# Patient Record
Sex: Male | Born: 1960 | Race: White | Hispanic: No | Marital: Married | Smoking: Former smoker
Health system: Southern US, Community
[De-identification: ages and names within clinical notes are randomized; demographics above are authoritative.]

## PROBLEM LIST (undated history)

## (undated) DIAGNOSIS — I499 Cardiac arrhythmia, unspecified: Secondary | ICD-10-CM

## (undated) DIAGNOSIS — R7303 Prediabetes: Secondary | ICD-10-CM

## (undated) DIAGNOSIS — Z8489 Family history of other specified conditions: Secondary | ICD-10-CM

## (undated) DIAGNOSIS — K649 Unspecified hemorrhoids: Secondary | ICD-10-CM

## (undated) DIAGNOSIS — R519 Headache, unspecified: Secondary | ICD-10-CM

## (undated) DIAGNOSIS — K645 Perianal venous thrombosis: Secondary | ICD-10-CM

## (undated) DIAGNOSIS — N189 Chronic kidney disease, unspecified: Secondary | ICD-10-CM

## (undated) DIAGNOSIS — H53002 Unspecified amblyopia, left eye: Secondary | ICD-10-CM

## (undated) DIAGNOSIS — M5416 Radiculopathy, lumbar region: Secondary | ICD-10-CM

## (undated) DIAGNOSIS — H9319 Tinnitus, unspecified ear: Secondary | ICD-10-CM

## (undated) DIAGNOSIS — A159 Respiratory tuberculosis unspecified: Secondary | ICD-10-CM

## (undated) DIAGNOSIS — M1990 Unspecified osteoarthritis, unspecified site: Secondary | ICD-10-CM

## (undated) HISTORY — PX: TONSILLECTOMY: SUR1361

## (undated) HISTORY — DX: Unspecified hemorrhoids: K64.9

## (undated) HISTORY — DX: Perianal venous thrombosis: K64.5

## (undated) HISTORY — DX: Tinnitus, unspecified ear: H93.19

## (undated) HISTORY — DX: Cardiac arrhythmia, unspecified: I49.9

## (undated) HISTORY — PX: COLONOSCOPY: SHX174

---

## 2001-07-16 ENCOUNTER — Encounter: Payer: Self-pay | Admitting: Emergency Medicine

## 2001-07-16 ENCOUNTER — Emergency Department (HOSPITAL_COMMUNITY): Admission: EM | Admit: 2001-07-16 | Discharge: 2001-07-16 | Payer: Self-pay | Admitting: Emergency Medicine

## 2002-04-07 ENCOUNTER — Ambulatory Visit (HOSPITAL_BASED_OUTPATIENT_CLINIC_OR_DEPARTMENT_OTHER): Admission: RE | Admit: 2002-04-07 | Discharge: 2002-04-07 | Payer: Self-pay | Admitting: Pulmonary Disease

## 2004-11-22 ENCOUNTER — Encounter: Admission: RE | Admit: 2004-11-22 | Discharge: 2004-11-22 | Payer: Self-pay | Admitting: Family Medicine

## 2004-12-11 HISTORY — PX: BACK SURGERY: SHX140

## 2005-07-16 ENCOUNTER — Emergency Department (HOSPITAL_COMMUNITY): Admission: EM | Admit: 2005-07-16 | Discharge: 2005-07-16 | Payer: Self-pay | Admitting: Emergency Medicine

## 2005-07-26 ENCOUNTER — Ambulatory Visit (HOSPITAL_COMMUNITY): Admission: RE | Admit: 2005-07-26 | Discharge: 2005-07-27 | Payer: Self-pay | Admitting: Neurosurgery

## 2008-05-14 ENCOUNTER — Ambulatory Visit (HOSPITAL_BASED_OUTPATIENT_CLINIC_OR_DEPARTMENT_OTHER): Admission: RE | Admit: 2008-05-14 | Discharge: 2008-05-14 | Payer: Self-pay | Admitting: Orthopedic Surgery

## 2008-10-01 ENCOUNTER — Encounter: Admission: RE | Admit: 2008-10-01 | Discharge: 2008-10-01 | Payer: Self-pay | Admitting: Gastroenterology

## 2009-01-11 HISTORY — PX: TOTAL KNEE ARTHROPLASTY: SHX125

## 2009-01-18 ENCOUNTER — Inpatient Hospital Stay (HOSPITAL_COMMUNITY): Admission: RE | Admit: 2009-01-18 | Discharge: 2009-01-21 | Payer: Self-pay | Admitting: Orthopedic Surgery

## 2011-03-28 LAB — CBC
HCT: 32.3 % — ABNORMAL LOW (ref 39.0–52.0)
HCT: 42.5 % (ref 39.0–52.0)
Hemoglobin: 12.5 g/dL — ABNORMAL LOW (ref 13.0–17.0)
Hemoglobin: 14.4 g/dL (ref 13.0–17.0)
MCHC: 34.9 g/dL (ref 30.0–36.0)
MCHC: 35.2 g/dL (ref 30.0–36.0)
MCHC: 35.2 g/dL (ref 30.0–36.0)
MCV: 93.5 fL (ref 78.0–100.0)
MCV: 93.9 fL (ref 78.0–100.0)
MCV: 94.8 fL (ref 78.0–100.0)
Platelets: 203 10*3/uL (ref 150–400)
Platelets: 219 10*3/uL (ref 150–400)
RBC: 4.5 MIL/uL (ref 4.22–5.81)
RDW: 12.7 % (ref 11.5–15.5)
RDW: 12.9 % (ref 11.5–15.5)
RDW: 13 % (ref 11.5–15.5)
WBC: 5.8 10*3/uL (ref 4.0–10.5)
WBC: 8.6 10*3/uL (ref 4.0–10.5)

## 2011-03-28 LAB — BASIC METABOLIC PANEL
BUN: 8 mg/dL (ref 6–23)
CO2: 29 mEq/L (ref 19–32)
Calcium: 8.8 mg/dL (ref 8.4–10.5)
Chloride: 103 mEq/L (ref 96–112)
Chloride: 98 mEq/L (ref 96–112)
Creatinine, Ser: 0.69 mg/dL (ref 0.4–1.5)
Creatinine, Ser: 0.78 mg/dL (ref 0.4–1.5)
Glucose, Bld: 166 mg/dL — ABNORMAL HIGH (ref 70–99)
Glucose, Bld: 189 mg/dL — ABNORMAL HIGH (ref 70–99)
Sodium: 134 mEq/L — ABNORMAL LOW (ref 135–145)

## 2011-03-28 LAB — PROTIME-INR
INR: 0.9 (ref 0.00–1.49)
INR: 1 (ref 0.00–1.49)
INR: 1.4 (ref 0.00–1.49)
Prothrombin Time: 12.6 seconds (ref 11.6–15.2)
Prothrombin Time: 16.4 seconds — ABNORMAL HIGH (ref 11.6–15.2)
Prothrombin Time: 18 seconds — ABNORMAL HIGH (ref 11.6–15.2)

## 2011-03-28 LAB — URINALYSIS, ROUTINE W REFLEX MICROSCOPIC
Glucose, UA: NEGATIVE mg/dL
Specific Gravity, Urine: 1.005 (ref 1.005–1.030)
pH: 6.5 (ref 5.0–8.0)

## 2011-03-28 LAB — COMPREHENSIVE METABOLIC PANEL
Alkaline Phosphatase: 54 U/L (ref 39–117)
BUN: 8 mg/dL (ref 6–23)
Chloride: 106 mEq/L (ref 96–112)
Glucose, Bld: 99 mg/dL (ref 70–99)
Potassium: 3.6 mEq/L (ref 3.5–5.1)
Total Bilirubin: 0.9 mg/dL (ref 0.3–1.2)

## 2011-04-25 NOTE — H&P (Signed)
NAME:  Russell Gillespie, Russell Gillespie        ACCOUNT NO.:  [id number]   MEDICAL RECORD NO.:  [id number]          PATIENT TYPE:  INP   LOCATION:  0003                         FACILITY:  [facility]   PHYSICIAN:  Ollen Gross, M.D.    DATE OF BIRTH:  04-Jan-1961   DATE OF ADMISSION:  01/18/2009  DATE OF DISCHARGE:                              HISTORY & PHYSICAL   CHIEF COMPLAINT:  Left knee pain.   HISTORY OF PRESENT ILLNESS:  The patient is a 50-year male who has been  seen by Dr. Lequita Halt for ongoing left knee pain and instability.  He  recently injured his back playing football.  Ended up with an ACL  reconstruction back in 2001.  He was known to have some degeneration  changes when his knee was scoped earlier this year with some attenuation  of his ACL.  He has had progressive pain.  He was seen by Dr. Lequita Halt  and found to have significant bone-on-bone tricompartmental changes  already.  It is felt he would reach benefit from undergoing surgical  intervention.  Risks and benefits have been discussed.  He elects to  proceed with surgery.   ALLERGIES:  NO KNOWN DRUG ALLERGIES.   CURRENT MEDICATIONS:  Omeprazole, glucosamine chondroitin, fish oil and  Zyrtec.   PAST MEDICAL HISTORY:  Reflux disease and history of renal calculi.   PAST SURGICAL HISTORY:  Left knee ACL reconstruction.  He has had a left  knee scope, right knee scope, L4-5 disk rupture repair.   FAMILY HISTORY:  No documented medical history.   SOCIAL HISTORY:  Married and works as an Electronics engineer-  conditioning.  Past smoker, quit about 3 years ago.  No alcohol.  Two  children.  Does have a caregiver lined up.  He has three steps entering  his home.   REVIEW OF SYSTEMS:  GENERAL:  No fevers, chills, night sweats.  NEUROLOGICAL:  No seizures, syncope, paralysis.  RESPIRATORY:  No  shortness of breath, productive cough or hemoptysis.  CARDIOVASCULAR:  No chest pain, or orthopnea.  GI:  No nausea, vomiting,  diarrhea or  constipation.  GU:  No dysuria or hematuria.  MUSCULOSKELETAL:  Left  knee.   PHYSICAL EXAMINATION:  VITAL SIGNS:  Pulse 60, respirations 12, blood  pressure 118/76.  GENERAL:  A 50 year old white male, well-nourished, well-developed, tall  frame, alert, oriented and cooperative, good historian.  HEENT:  Normocephalic, atraumatic.  Pupils are reactive.  Oropharynx  clear.  EOMs intact.  NECK:  Supple.  CHEST:  Clear.  HEART:  Regular rate and rhythm without murmur, S1-S2 noted.  ABDOMEN:  Soft, nontender.  Bowel sounds present.  RECTAL/BREASTS/GENITOURINARY:  Not done.  Not pertinent to present  illness.  EXTREMITIES:  Left knee, no effusion.  Range of motion 0-125, marked  crepitus, significant laxity.   IMPRESSION:  Osteoarthritis left knee.   PLAN:  The patient admitted to [facility] to undergo a left  total knee replacement arthroplasty.  Surgery will be performed by Dr.  Ollen Gross.      Alexzandrew L. Perkins, P.A.C.      Ollen Gross, M.D.  Electronically Signed    ALP/MEDQ  D:  01/18/2009  T:  01/18/2009  Job:  [id number]   cc:   Stacie Acres. Cliffton Asters, M.D.  Fax: [phone]   Ollen Gross, M.D.  Fax: [phone]

## 2011-04-25 NOTE — Op Note (Signed)
NAME:  Russell Gillespie, Russell Gillespie        ACCOUNT NO.:  [id number]   MEDICAL RECORD NO.:  [id number]          PATIENT TYPE:  AMB   LOCATION:  DSC                          FACILITY:  [facility]   PHYSICIAN:  Loreta Ave, M.D. DATE OF BIRTH:  [birth year]   DATE OF PROCEDURE:  05/14/2008  DATE OF DISCHARGE:                               OPERATIVE REPORT   PREOPERATIVE DIAGNOSES:  1. Left knee degenerative arthritis.  2. Previous arthroscopic anterior cruciate ligament reconstruction      with allograft.  3. New traumatic injury with lateral meniscus tear, in question injury      of the anterior cruciate ligament graft.   POSTOPERATIVE DIAGNOSES:  1. Left knee degenerative arthritis.  2. Previous arthroscopic anterior cruciate ligament reconstruction      with allograft.  3. New traumatic injury with lateral meniscus tear in question injury      of the anterior cruciate ligament graft with stretch injury of      anterior cruciate ligament but without significant instability.      One large and numerous small loose bodies.  Recurrent tear, lateral      meniscus.  Grade 3 changes all 3 compartments.   PROCEDURE:  1. Left knee exam under anesthesia, arthroscopy, tricompartmental      chondroplasty, removal of numerous small and 1 large loose body.  2. Partial lateral meniscectomy.  3. Assessment of anterior cruciate ligament.   SURGEON:  Loreta Ave, MD   ASSISTANT:  Genene Churn. Barry Dienes, [place]   ANESTHESIA:  General.   BLOOD LOSS:  Minimal.   SPECIMENS:  None.   CULTURES:  None.   COMPLICATIONS:  None.   DRESSING:  Soft compressive.   TOURNIQUET TIME:  30 minutes.   PROCEDURE:  The patient was brought to the operating room, placed on the  operating table in supine position.  After adequate anesthesia has been  obtained, knee examined.  Increased excursion with Lachman and drawer  but still coming to an endpoint and no rotary instability.  Full motion  of the ligament stable.   Tourniquet applied, prepped and draped in usual  sterile fashion.  Exsanguinated with elevation and Esmarch tourniquet  inflated to 350 mmHg.  Three portals created, one superolateral, one  each medial and lateral parapatellar.  Inflow catheter induced and knee  distended.  Arthroscope was introduced and knee inspected.  Good  patellofemoral tracking.  Grade 3 changes.  Numerous chondral loose  bodies.  One large about a centimeter osteochondral loose body found and  removed with a large grasping forceps.  ACL graft had some attenuation  stretching but was still intact and functional.  Some spurring around  and debris in the notch.  Medial compartment diffuse grade 3,  approaching grade 4 changes.  Previous near-complete medial  meniscectomy.  No recurrent tears.  Lateral compartment grade 3 changes  with recurrent tearing of what was left of the posterior half.  Taken  out to stable rim, tapered into remaining meniscus.  All compartments  then inspected.  All loose fragments were removed.  ACL thoroughly  inspected from all angles and I did not feel  revision reconstruction was  indicated.  Instruments fully removed.  Portals and knee were injected  with Marcaine.  Portals closed with 4-0 nylon.  Sterile compressive  dressing applied.  Anesthesia reversed.  Brought to recovery room.  Tolerated the surgery well.  No complications.      Loreta Ave, M.D.  Electronically Signed     DFM/MEDQ  D:  05/14/2008  T:  05/15/2008  Job:  [id number]

## 2011-04-25 NOTE — Op Note (Signed)
NAME:  Russell Gillespie, Russell Gillespie        ACCOUNT NO.:  [id number]   MEDICAL RECORD NO.:  [id number]          PATIENT TYPE:  INP   LOCATION:  1619                         FACILITY:  [facility]   PHYSICIAN:  Ollen Gross, M.D.    DATE OF BIRTH:  04/11/1961   DATE OF PROCEDURE:  01/18/2009  DATE OF DISCHARGE:                               OPERATIVE REPORT   PREOPERATIVE DIAGNOSIS:  Osteoarthritis left knee.   POSTOPERATIVE DIAGNOSIS:  Osteoarthritis left knee.   PROCEDURE:  Left total knee arthroplasty.   SURGEON:  F. Aluisio, M.D.   ASSISTANT:  Oneida Alar, PA-C   ANESTHESIA:  General with postoperative Marcaine pain pump.   ESTIMATED BLOOD LOSS:  Minimal.   DRAINS:  None.   TOURNIQUET TIME:  40 minutes at 300 mmHg.   COMPLICATIONS:  None.   CONDITION:  Stable to recovery room.   BRIEF CLINICAL NOTE:  Russell Gillespie is a 50 year old male who has severe  arthritic change of his left knee post injury and ACL reconstruction.  He has had progressively worsening pain, instability and dysfunction.  He presents now for a total knee arthroplasty.   PROCEDURE IN DETAIL:  After successful administration of general  anesthetic, a tourniquet is placed high on his left thigh and his left  lower extremity is prepped and draped in the usual sterile fashion.  The  extremity is wrapped in Esmarch, knee flexed, tourniquet inflated to 300  mmHg.  A midline incision is made with a 10 blade through subcutaneous  tissue to the level of the extensor mechanism.  A fresh blade is used to  make a medial parapatellar arthrotomy.  Soft tissue on the proximal  medial tibia is subperiosteally elevated to the joint line with the  knife and into the semimembranosus bursa with a Cobb elevator.  Soft  tissue laterally is elevated with attention being paid to avoid the  patellar tendon on the tibial tubercle.  Patella subluxed laterally,  knee flexed 90 degrees and the PCL removed.  The ACL graft was just a  flimsy  piece of soft tissue at this point, it was nonfunctional.  It is  also removed.  A drill is used to create a starting hole in the distal  femur and the canal is thoroughly irrigated.  The 5 degree left valgus  alignment guide is placed and referencing off the posterior condyles  rotation is marked and the block pinned to remove 10 mm off the distal  femur.  Distal femoral resection is made with an oscillating saw.  Sizing blocks placed, size 4 is the most appropriate.  Rotation is  marked off the epicondylar axis.  The size 4 cutting block is placed and  the anterior-posterior chamfer cuts are made.   The tibia is subluxed forward and the menisci are removed.  The  extramedullary tibial alignment guide is placed referencing proximally  at the medial aspect of the tibial tubercle and distally along the  second metatarsal axis and tibial crest.  A block is pinned to remove  about 10 mm off the non-deficient lateral side.  Tibial resection is  made with an oscillating saw.  Size  5 is the most appropriate tibial  component and the proximal tibia is prepared with the modular drill and  keel punch for the size 5.  Femoral preparation is completed with the  intercondylar cut for the size 4.   Size 5 mobile bearing tibial trial, size 4 posterior stabilized femoral  trial and a 10 mm posterior stabilized rotating platform insert trial  are placed.  At the tendon, there is a tiny bit of varus-valgus plane  with the 12.5 which allowed full extension with excellent varus valgus  anterior-posterior balance throughout full range of motion.  The patella  was then everted and it was measured to be 27 mm.  Freehand resection is  taken to 15 mm, a 41 template is placed, lug holes are drilled, a trial  patella is placed and it tracks normally.  Osteophytes are removed off  the posterior femur with the trial in place.  All trials are removed and  the cut bone surfaces are prepared with pulsatile lavage.   Cement is  mixed and, once ready for implantation, the size 5 mobile bearing tibial  tray size 4 posterior stabilized femur and 41 patella are cemented into  place and the patella is held with a clamp.  Trial 12.5-mm inserts are  placed, knee held in full extension and all extruded cement removed.  When the cement is fully hardened, then the permanent 12.5 mm posterior  stabilized rotating platform insert is placed into the tibial tray.  The  wounds are copiously irrigated with saline solution.  The FloSeal is  injected on the posterior capsule, mediolateral gutters and  suprapatellar area.  A moist sponge is placed and the tourniquet  released for a total time of 40 minutes.  The sponge is held for 2  minutes and removed.  Mild bleeding is encountered.  The bleeding that  is encountered is stopped with electrocautery.  The wounds are again  irrigated and the arthrotomy closed with interrupted #1 PDS.  Flexion  against gravity is about 140 degrees.  Subcu is closed with interrupted  2-0 Vicryl and subcuticular running 4-0 Monocryl.  The catheter for the  Marcaine pain pump is placed and the pump is initiated.  Steri-Strips  and a bulky sterile dressing are applied.  The knee is placed into a  knee immobilizer.  The patient is awakened and transported to recovery  in stable condition.      Ollen Gross, M.D.  Electronically Signed     FA/MEDQ  D:  01/18/2009  T:  01/18/2009  Job:  [id number]

## 2011-04-28 NOTE — Discharge Summary (Signed)
NAME:  Russell Gillespie, Russell Gillespie        ACCOUNT NO.:  [id number]   MEDICAL RECORD NO.:  [id number]          PATIENT TYPE:  INP   LOCATION:  1619                         [facility]ACILITY:  [facility]   PHYSICIAN:  Ollen Gross, M.D.    DATE OF BIRTH:  [birth year]   DATE OF ADMISSION:  01/18/2009  DATE OF DISCHARGE:  01/21/2009                               DISCHARGE SUMMARY   ADMITTING DIAGNOSES:  1. Osteoarthritis, left knee.  2. Reflux.  3. History of renal calculi.   DISCHARGE DIAGNOSES:  1. Osteoarthritis, left knee, status post left total knee replacement      arthroplasty.  2. Mild postoperative hyponatremia.  3. Reflux.  4. History of renal calculi.   PROCEDURE:  January 18, 2009:  Left total knee replacement arthroplasty.   SURGEON:  Dr. Lequita Halt assisted by Oneida Alar, PA-C.   ANESTHESIA:  General.   TOURNIQUET TIME:  Forty minutes.   CONSULTS:  None.   BRIEF HISTORY:  Russell Gillespie is a 47-year male with severe end-stage  arthritis, left knee, post-injury ACL reconstruction, progressive  worsening pain, instability and dysfunction who now presents for total  knee.   LABORATORY DATA:  Preop CBC showed hemoglobin of 14.4, hematocrit 42.5,  white cell count 5.8, platelets 245, postop hemoglobin 12.5, drifted  down to 11.4, back up to 11.6 with a hematocrit of 33.  PT/PTT preop  12.6 and 24 respectively.  INR 0.9.  Serial protimes were followed per  Coumadin protocol.  Last PT/INR were 18 and 1.4 respectively.  Chem  panel on admission:  All within normal limits.  Serial BMETs were  followed.  Sodium dropped from 140 to 134, back up to 137.  Preop UA was  negative.  Blood group type A positive.   EKG May 14, 2008:  Sinus bradycardia with marked sinus arrhythmia,  otherwise normal.  No previous tracing; confirmed by Dr. Jerral Bonito.   HOSPITAL COURSE:  The patient was admitted to [facility] and  taken to the OR, underwent above-stated procedure without complication.  The  patient tolerated the procedure well, later transferred to the  recovery room and orthopedic floor, started on PCA and p.o. analgesics  for pain control following surgery.  He was started on Coumadin for DVT  prophylaxis.  He was doing pretty well on the morning of day 1.  Pain  was under good control.  He had a little bit of drop in his sodium which  was felt to be of dilutional component.  He did really well with his  therapy, walking over 200 feet.  He was started back on his home meds of  omeprazole.  By postop day #2, he had increased up to 300 feet with  minimal assistance.  Dressing change incision looked good.  Hemoglobin  was stable.  Sodium was already back up to 137.  We discontinued his  fluids, weaning him over to p.o. medications, and he was ready go home  by the following day of January 21, 2009.   DISCHARGE PLAN:  1. The patient was discharged home on January 21, 2009.  2. Discharge diagnoses:  Please see above.  3.  Discharge medications:  Vicodin, Lovenox, Robaxin and Coumadin.  4. Diet:  No restrictions.  Diet as tolerated.  5. Followup:  Two weeks.  6. Activity:  Weightbearing as tolerated.  Total knee protocol.  Home      health PT and nursing.   DISPOSITION:  Home.   CONDITION UPON DISCHARGE:  Improved.      Russell Gillespie, P.A.C.      Ollen Gross, M.D.  Electronically Signed    ALP/MEDQ  D:  02/23/2009  T:  02/23/2009  Job:  [id number]   cc:   Stacie Acres. Cliffton Asters, M.D.  Fax: [phone]

## 2011-04-28 NOTE — Op Note (Signed)
NAME:  BURREL, LEGRAND        ACCOUNT NO.:  [id number]   MEDICAL RECORD NO.:  [id number]          PATIENT TYPE:  OIB   LOCATION:  2550                         FACILITY:  [facility]   PHYSICIAN:  Hilda Lias, M.D.   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   PROCEDURE:  Right L4 hemilaminectomy and removal of fragment compromising  the L5 nerve root, foraminotomy, microscope.   SURGEON:  Hilda Lias, M.D.   ASSISTANT:  Clydene Fake, M.D.   CLINICAL HISTORY:  The patient was seen by me yesterday because of back and  right leg pain.  The pain is getting worse.  The patient had an MRI about  three months ago which shows a bulging disc.  MRI done yesterday showed  fragment compromising the L5 nerve root.  The patient wanted to proceed with  surgery as soon as possible.  The risks were explained during the history  and physical.   DESCRIPTION OF PROCEDURE:  The patient was taken to the OR and positioned in  a prone manner.  The back was prepped with Betadine.  A midline incision was  made.  The muscles were retracted laterally on the right side.  We did a  laminotomy of L4 and L5.  Nevertheless, trying to displace the thecal sac  was difficult because the area was quite a bit narrow and the canal was  vertical.  Because of that, we went ahead and proceeded with hemilaminectomy  of L4 on the right side and from then on, we worked our way down to the L4-  L5 space.  Indeed, the L5 nerve root was displaced posteriorly and  superiorly.  An incision was made into the area and three large fragments of  disc into the body of L5 and one to the foramen were removed.  Then, we  investigated the area of the disc space and, indeed, saw scar tissue  but  there was no opening.  Because of that, we decided not to enter the disc  space.  At the end, we had a good decompression.  The area was irrigated.  Hemostasis was done.  Depo-Medrol was left in the epidural space and the  wound was closed with Vicryl and Steri-Strips.           ______________________________  Hilda Lias, M.D.     EB/MEDQ  D:  07/26/2005  T:  07/26/2005  Job:  [id number]

## 2011-12-12 HISTORY — PX: ROTATOR CUFF REPAIR: SHX139

## 2012-05-23 ENCOUNTER — Encounter (INDEPENDENT_AMBULATORY_CARE_PROVIDER_SITE_OTHER): Payer: Self-pay | Admitting: General Surgery

## 2012-05-23 ENCOUNTER — Ambulatory Visit (INDEPENDENT_AMBULATORY_CARE_PROVIDER_SITE_OTHER): Payer: 59 | Admitting: Surgery

## 2012-05-23 ENCOUNTER — Encounter (INDEPENDENT_AMBULATORY_CARE_PROVIDER_SITE_OTHER): Payer: Self-pay | Admitting: Surgery

## 2012-05-23 VITALS — BP 120/82 | HR 68 | Temp 98.2°F | Resp 18 | Ht 73.0 in | Wt 235.6 lb

## 2012-05-23 DIAGNOSIS — K645 Perianal venous thrombosis: Secondary | ICD-10-CM

## 2012-05-23 HISTORY — DX: Perianal venous thrombosis: K64.5

## 2012-05-23 NOTE — Patient Instructions (Signed)
ANORECTAL SURGERY: POST OP INSTRUCTIONS  1. Take your usually prescribed home medications unless otherwise directed. 2. DIET: Follow a light bland diet the first 24 hours after arrival home, such as soup, liquids, crackers, etc.  Be sure to include lots of fluids daily.  Avoid fast food or heavy meals as your are more likely to get nauseated.  Eat a low fat the next few days after surgery.   3. PAIN CONTROL: a. Pain is best controlled by a usual combination of three different methods TOGETHER: i. Ice/Heat ii. Over the counter pain medication iii. Prescription pain medication b. Most patients will experience some swelling and discomfort in the anus/rectal area. and incisions.  Ice packs or heat (30-60 minutes up to 6 times a day) will help. Use ice for the first few days to help decrease swelling and bruising, then switch to heat such as warm towels, sitz baths, warm baths, etc to help relax tight/sore spots and speed recovery.  Some people prefer to use ice alone, heat alone, alternating between ice & heat.  Experiment to what works for you.  Swelling and bruising can take several weeks to resolve.   c. It is helpful to take an over-the-counter pain medication regularly for the first few weeks.  Choose one of the following that works best for you: i. Naproxen (Aleve, etc)  Two 220mg tabs twice a day ii. Ibuprofen (Advil, etc) Three 200mg tabs four times a day (every meal & bedtime) iii. Acetaminophen (Tylenol, etc) 500-650mg four times a day (every meal & bedtime) d. A  prescription for pain medication (such as oxycodone, hydrocodone, etc) should be given to you upon discharge.  Take your pain medication as prescribed.  i. If you are having problems/concerns with the prescription medicine (does not control pain, nausea, vomiting, rash, itching, etc), please call us [phone] to see if we need to switch you to a different pain medicine that will work better for you and/or control your side effect  better. ii. If you need a refill on your pain medication, please contact your pharmacy.  They will contact our office to request authorization. Prescriptions will not be filled after 5 pm or on week-ends. 4. KEEP YOUR BOWELS REGULAR a. The goal is one bowel movement a day b. Avoid getting constipated.  Between the surgery and the pain medications, it is common to experience some constipation.  Increasing fluid intake and taking a fiber supplement (such as Metamucil, Citrucel, FiberCon, MiraLax, etc) 1-2 times a day regularly will usually help prevent this problem from occurring.  A mild laxative (prune juice, Milk of Magnesia, MiraLax, etc) should be taken according to package directions if there are no bowel movements after 48 hours. c. Watch out for diarrhea.  If you have many loose bowel movements, simplify your diet to bland foods & liquids for a few days.  Stop any stool softeners and decrease your fiber supplement.  Switching to mild anti-diarrheal medications (Kayopectate, Pepto Bismol) can help.  If this worsens or does not improve, please call us.  5. Wound Care a. Remove your bandages the day after surgery.  Unless discharge instructions indicate otherwise, leave your bandage dry and in place overnight.  Remove the bandage during your first bowel movement.   b. Allow the wound packing to fall out over the next few days.  You can trim exposed gauze / ribbon as it falls out.  You do not need to repack the wound unless instructed otherwise.  Wear an   absorbent pad or soft cotton gauze in your underwear as needed to catch any drainage and help keep the area  c. Keep the area clean and dry.  Bathe / shower every day.  Keep the area clean by showering / bathing over the incision / wound.   It is okay to soak an open wound to help wash it.  Wet wipes or showers / gentle washing after bowel movements is often less traumatic than regular toilet paper. d. Bonita Quin may have some styrofoam-like soft packing in  the rectum which will come out with the first bowel movement.  e. You will often notice bleeding with bowel movements.  This should slow down by the end of the first week of surgery f. Expect some drainage.  This should slow down, too, by the end of the first week of surgery.  Wear an absorbent pad or soft cotton gauze in your underwear until the drainage stops. 6. ACTIVITIES as tolerated:   a. You may resume regular (light) daily activities beginning the next day--such as daily self-care, walking, climbing stairs--gradually increasing activities as tolerated.  If you can walk 30 minutes without difficulty, it is safe to try more intense activity such as jogging, treadmill, bicycling, low-impact aerobics, swimming, etc. b. Save the most intensive and strenuous activity for last such as sit-ups, heavy lifting, contact sports, etc  Refrain from any heavy lifting or straining until you are off narcotics for pain control.   c. DO NOT PUSH THROUGH PAIN.  Let pain be your guide: If it hurts to do something, don't do it.  Pain is your body warning you to avoid that activity for another week until the pain goes down. d. You may drive when you are no longer taking prescription pain medication, you can comfortably sit for long periods of time, and you can safely maneuver your car and apply brakes. e. Bonita Quin may have sexual intercourse when it is comfortable.  7. FOLLOW UP in our office a. Please call [facility] at [phone] to set up an appointment to see your surgeon in the office for a follow-up appointment approximately 2 weeks after your surgery. b. Make sure that you call for this appointment the day you arrive home to insure a convenient appointment time. 10. IF YOU HAVE DISABILITY OR FAMILY LEAVE FORMS, BRING THEM TO THE OFFICE FOR PROCESSING.  DO NOT GIVE THEM TO YOUR DOCTOR.        WHEN TO CALL us [phone]: 1. Poor pain control 2. Reactions / problems with new medications (rash/itching, nausea,  etc)  3. Fever over 101.5 F (38.5 C) 4. Inability to urinate 5. Nausea and/or vomiting 6. Worsening swelling or bruising 7. Continued bleeding from incision. 8. Increased pain, redness, or drainage from the incision  The clinic staff is available to answer your questions during regular business hours (8:30am-5pm).  Please don't hesitate to call and ask to speak to one of our nurses for clinical concerns.   A surgeon from [facility] is always on call at the hospitals   If you have a medical emergency, go to the nearest emergency room or call 911.    [facility] [address], [place], [place]  [address] ? MAIN: [phone] ? TOLL FREE: [phone] ? FAX [phone] [email or web address]  HEMORRHOIDS   The rectum is the last few inches of your colon, and it naturally stretches to hold stool.  Hemorrhoidal piles are natural clusters of blood  clusters of blood vessels that help the rectum stretch to hold stool and allow bowel movements to eliminate feces.  Hemorrhoids are abnormally swollen blood vessels in the rectum.  Too much pressure in the rectum causes hemorrhoids by forcing blood to stretch and bulge the walls of the veins, sometimes even rupturing them.  Hemorrhoids can become like varicose veins you might see on a person's legs. When bulging hemorrhoidal veins are irritated, they can swell, burn, itch, become very painful, and bleed. Once the rectal veins have been stretched out and hemorrhoids created, they are difficult to get rid of completely and tend to recur with less straining than it took to cause them in the first place. Fortunately, good habits and simple medical treatment usually control hemorrhoids well, and surgery is only recommended in unusually severe cases. Some of the most frequent causes of hemorrhoids:    Constant sitting    Straining with bowel movements (from constipation or hard stools)    Diarrhea    Sitting  on the toilet for a long time    Severe coughing    Childbirth    Heavy Lifting  Types of Hemorrhoids:    Internal hemorrhoids usually don't hurt or itch; they are deep inside the rectum and usually have no sensation. However, internal hemorrhoids can bleed.  Such bleeding should not be ignored and mask blood from a dangerous source like colorectal cancer, so persistent rectal bleeding should be investigated with a colonoscopy.    External hemorrhoids cause most of the symptoms - pain, burning, and itching. Unirritated hemorrhoids can look like small skin tags coming out of the anus.     Thrombosed hemorrhoids can form when a hemorrhoid blood vessel bursts and causes the hemorrhoid to swell.  A purple blood clot can form in it and become an excruciatingly painful lump at the anus. Because of these unpleasant symptoms, immediate incision and drainage by a surgeon at an office visit can provide much relief of the pain.    PREVENTION Avoiding the causes listed in above will prevent most cases of hemorrhoids, but this advice is sometimes hard to follow:  How can you avoid sitting all day if you have a seated job? Also, we try to avoid coughing and diarrhea, but sometimes it's beyond your control.  Still, there are some practical hints to help:    If your main job activity is seated, always stand or walk during your breaks. Make it a point to stand and walk at least 5 minutes every hour and try to shift frequently in your chair to avoid direct rectal pressure.    Always exhale as you strain or lift. Don't hold your breath.    Treat coughing, diarrhea and constipation early since irritated hemorrhoids may soon follow.    Do not delay or try to prevent a bowel movement when the urge is present.   Exercise regularly (walking or jogging 60 minutes a day) to stimulate the bowels to move.   Avoid dry toilet paper when cleaning after bowel movements.  Moistened tissues such as baby wipes are less irritating.   Lightly pat the rectal area dry.  Using irrigating showers or bottle irrigation washing can more gently clean this sensitive area.   Keep the anal and genital area clean and  dry.  Talcum or baby powders can help   GET YOUR STOOLS SOFT.   This is the most important way to prevent irritated hemorrhoids.  Hard stools are like sandpaper to the anorectal   canal and will cause more problems.   The goal: ONE SOFT BOWEL MOVEMENT A DAY!  To have soft, regular bowel movements:    Drink at least 8 tall glasses of water a day.     AVOID CONSTIPATION    Take plenty of fiber.  Fiber is the undigested part of plant food that passes into the colon, acting s "natures broom" to encourage bowel motility and movement.  Fiber can absorb and hold large amounts of water. This results in a larger, bulkier stool, which is soft and easier to pass. Work gradually over several weeks up to 6 servings a day of fiber (25g a day even more if needed) in the form of: o Vegetables -- Root (potatoes, carrots, turnips), leafy green (lettuce, salad greens, celery, spinach), or cooked high residue (cabbage, broccoli, etc) o Fruit -- Fresh (unpeeled skin & pulp), Dried (prunes, apricots, cherries, etc ),  or stewed ( applesauce)  o Whole grain breads, pasta, etc (whole wheat)  o Bran cereals    Bulking Agents -- This type of water-retaining fiber generally is easily obtained each day by one of the following:  o Psyllium bran -- The psyllium plant is remarkable because its ground seeds can retain so much water. This product is available as Metamucil, Konsyl, Effersyllium, Per Diem Fiber, or the less expensive generic preparation in drug and health food stores. Although labeled a laxative, it really is not a laxative.  o Methylcellulose -- This is another fiber derived from wood which also retains water. It is available as Citrucel. o Polyethylene Glycol - and "artificial" fiber commonly called Miralax or Glycolax.  It is helpful for people  with gassy or bloated feelings with regular fiber o Flax Seed - a less gassy fiber than psyllium   No reading or other relaxing activity while on the toilet. If bowel movements take longer than 5 minutes, you are too constipated   Laxatives can be useful for a short period if constipation is severe o Osmotics (Milk of Magnesia, Fleets phosphosoda, Magnesium citrate, MiraLax, GoLytely) are safer than  o Stimulants (Senokot, Castor Oil, Dulcolax, Ex Lax)    o Do not take laxatives for more than 7days in a row.   Laxatives are not a good long-term solution as it can stress the intestine and colon and causes too much mineral and fluid losses.    If badly constipated, try a Bowel Retraining Program: o Do not use laxatives.  o Eat a diet high in roughage, such as bran cereals and leafy vegetables.  o Drink six (6) ounces of prune or apricot juice each morning.  o Eat two (2) large servings of stewed fruit each day.  o Take one (1) heaping dose of a bulking agent (ex. Metamucil, Citrucel, Miralax) twice a day.  o Use sugar-free sweetener when possible to avoid excessive calories.  o Eat a normal breakfast.  o Set aside 15 minutes after breakfast to sit on the toilet, but do not strain to have a bowel movement.  o If you do not have a bowel movement by the third day, use an enema and repeat the above steps.    AVOID DIARRHEA o Switch to liquids and simpler foods for a few days to avoid stressing your intestines further. o Avoid dairy products (especially milk & ice cream) for a short time.  The intestines often can lose the ability to digest lactose when stressed. o Avoid foods that cause gassiness or bloating.  Typical foods   other legumes, cabbage, broccoli, and dairy foods.  Every person has some sensitivity to other foods, so listen to our body and avoid those foods that trigger problems for you. o Adding fiber (Citrucel, Metamucil, psyllium, Miralax) gradually can help thicken stools by  absorbing excess fluid and retrain the intestines to act more normally.  Slowly increase the dose over a few weeks.  Too much fiber too soon can backfire and cause cramping & bloating. o Probiotics (such as active yogurt, Align, etc) may help repopulate the intestines and colon with normal bacteria and calm down a sensitive digestive tract.  Most studies show it to be of mild help, though, and such products can be costly. o Medicines:   Bismuth subsalicylate (ex. Kayopectate, Pepto Bismol) every 30 minutes for up to 6 doses can help control diarrhea.  Avoid if pregnant.   Loperamide (Immodium) can slow down diarrhea.  Start with two tablets (4mg  total) first and then try one tablet every 6 hours.  Avoid if you are having fevers or severe pain.  If you are not better or start feeling worse, stop all medicines and call your doctor for advice o Call your doctor if you are getting worse or not better.  Sometimes further testing (cultures, endoscopy, X-ray studies, bloodwork, etc) may be needed to help diagnose and treat the cause of the diarrhea.   If these preventive measures fail, you must take action right away! Hemorrhoids are one condition that can be mild in the morning and become intolerable by nightfall.

## 2012-05-23 NOTE — Progress Notes (Signed)
Subjective:     Patient ID: Russell Gillespie, male   DOB: 1961/04/12, 51 y.o.   MRN: [id number]  HPI  Russell Gillespie  06-09-1961 [id number]  Patient Care Team: Sissy Hoff, MD as PCP - General (Family Medicine)  This patient is a 51 y.o.male who presents today for surgical evaluation at the request of Dr. Azucena Cecil.   Reason for visit: Probable thrombosed hemorrhoid.  Patient is a pleasant male who's had hemorrhoid flares in the past. 3 have been severe. This is the worst one. It's been going on for a week. He saw his primary care physician. Suppositories were given. He does not think it's helped much. No bleeding. He has a bowel movement about every day. He recently had a colonoscopy that was uneventful. This did not start right after the colonoscopy. No prior anal/rectal treatments.  Patient Active Problem List  Diagnosis  . Internal thrombosed hemorrhoid-left lateral    Past Medical History  Diagnosis Date  . Hemorrhoids   . GERD (gastroesophageal reflux disease)   . Tinnitus   . Irregular heart beat   . Internal thrombosed hemorrhoid-left lateral 05/23/2012    Past Surgical History  Procedure Date  . Total knee arthroplasty     left    History   Social History  . Marital Status: Married    Spouse Name: N/A    Number of Children: N/A  . Years of Education: N/A   Occupational History  . Not on file.   Social History Main Topics  . Smoking status: Never Smoker   . Smokeless tobacco: Not on file  . Alcohol Use: Yes  . Drug Use: No  . Sexually Active:    Other Topics Concern  . Not on file   Social History Narrative  . No narrative on file    No family history on file.  Current Outpatient Prescriptions  Medication Sig Dispense Refill  . cetirizine (ZYRTEC) 10 MG tablet Take 10 mg by mouth daily.      . fexofenadine (ALLEGRA) 180 MG tablet Take 180 mg by mouth daily.      . fish oil-omega-3 fatty acids 1000 MG capsule Take 2 g by mouth  daily.      . hydrocortisone (ANUSOL-HC) 25 MG suppository Place 25 mg rectally 2 (two) times daily.      Marland Kitchen omeprazole (PRILOSEC) 20 MG capsule Take 20 mg by mouth daily.      . temazepam (RESTORIL) 30 MG capsule Take 30 mg by mouth at bedtime as needed.         No Known Allergies  BP 120/82  Pulse 68  Temp 98.2 F (36.8 C) (Temporal)  Resp 18  Ht 6\' 1"  (1.854 m)  Wt 235 lb 9.6 oz (106.867 kg)  BMI 31.08 kg/m2  No results found.   Review of Systems  Constitutional: Negative for fever, chills and diaphoresis.  HENT: Negative for sore throat, trouble swallowing and neck pain.   Eyes: Negative for photophobia and visual disturbance.  Respiratory: Negative for choking and shortness of breath.   Cardiovascular: Negative for chest pain and palpitations.  Gastrointestinal: Negative for nausea, vomiting, abdominal distention, anal bleeding and rectal pain.  Genitourinary: Negative for dysuria, urgency, difficulty urinating and testicular pain.  Musculoskeletal: Negative for myalgias, arthralgias and gait problem.  Skin: Negative for color change and rash.  Neurological: Negative for dizziness, speech difficulty, weakness and numbness.  Hematological: Negative for adenopathy.  Psychiatric/Behavioral: Negative for hallucinations, confusion and agitation.  Objective:   Physical Exam  Constitutional: He is oriented to person, place, and time. He appears well-developed and well-nourished. No distress.  HENT:  Head: Normocephalic.  Mouth/Throat: Oropharynx is clear and moist. No oropharyngeal exudate.  Eyes: Conjunctivae and EOM are normal. Pupils are equal, round, and reactive to light. No scleral icterus.  Neck: Normal range of motion. No tracheal deviation present.  Cardiovascular: Normal rate, normal heart sounds and intact distal pulses.   Pulmonary/Chest: Effort normal. No respiratory distress.  Abdominal: Soft. He exhibits no distension. There is no tenderness. Hernia  confirmed negative in the right inguinal area and confirmed negative in the left inguinal area.       Incisions clean with normal healing ridges.  No hernias  Genitourinary:       Exam done with assistance of male Medical Assistant in the room.  Perianal skin clean with good hygiene.  No pruritis.  No external skin tags / hemorrhoids of significance.  No pilonidal disease.  No fissure.  No abscess/fistula.    Tolerates digital and anoscopic rectal exam after anorectal block.  Normal sphincter tone.  No rectal masses.    Hemorrhoidal piles mildly enlarged except Left lateral thrombosed hemorrhoid - internal   Musculoskeletal: Normal range of motion. He exhibits no tenderness.  Neurological: He is alert and oriented to person, place, and time. No cranial nerve deficit. He exhibits normal muscle tone. Coordination normal.  Skin: Skin is warm and dry. No rash noted. He is not diaphoretic.  Psychiatric: He has a normal mood and affect. His behavior is normal.       Assessment:     Thrombosed left lateral hemorrhoid    Plan:     I went ahead and lanced it:  The anatomy & physiology of the anorectal region was discussed.  The pathophysiology of hemorrhoids and differential diagnosis was discussed.  Natural history progression  of worsening swelling with eventual resolution over weeks to months was discussed.   I stressed the importance of a bowel regimen to have daily soft bowel movements to minimize progression of disease.     The patient's symptoms are not adequately controlled.  Therefore, I recommended incision to drain the thrombosed hemorrhoid..  I went over the technique, risks, benefits, and alternatives.   Questions were answered.  The patient expressed understanding & wished to proceed.  The patient was positioned in the lateral decubitus position.  I placed a field block of local anaesthetic.  Perianal & rectal examination was done.  I incised & decompressed the thrombosed  hemorrhoid.  The patient tolerated the procedure well.    Educational handouts further explaining the pathology, treatment options, and bowel regimen were given as well.   Increase activity as tolerated.  Do not push through pain.  Advanced on diet as tolerated. Bowel regimen to avoid problems.  Return to clinic 3 weeks, p.r.n. if he resolves all Sx.  I gave him my card.   The patient expressed understanding and appreciation

## 2012-06-24 ENCOUNTER — Encounter (INDEPENDENT_AMBULATORY_CARE_PROVIDER_SITE_OTHER): Payer: 59 | Admitting: Surgery

## 2016-04-13 ENCOUNTER — Ambulatory Visit: Payer: Self-pay | Admitting: Orthopedic Surgery

## 2016-04-13 NOTE — Progress Notes (Signed)
Preoperative surgical orders have been place into the [facility] for Paulene FloorChristopher L Whelchel on 04/13/2016, 10:55 AM  by Patrica DuelPERKINS, Aianna Fahs for surgery on 05-03-16.  Preop Total Hip - Anterior Approach orders including IV Tylenol, and IV Decadron as long as there are no contraindications to the above medications. Avel Peacerew Camora Tremain, PA-C

## 2016-04-25 ENCOUNTER — Encounter (HOSPITAL_COMMUNITY): Payer: Self-pay

## 2016-04-25 ENCOUNTER — Encounter (HOSPITAL_COMMUNITY)
Admission: RE | Admit: 2016-04-25 | Discharge: 2016-04-25 | Disposition: A | Discharge status: Home / Self Care | Payer: Managed Care, Other (non HMO) | Source: Ambulatory Visit | Attending: Orthopedic Surgery | Admitting: Orthopedic Surgery

## 2016-04-25 DIAGNOSIS — M1611 Unilateral primary osteoarthritis, right hip: Secondary | ICD-10-CM | POA: Diagnosis not present

## 2016-04-25 DIAGNOSIS — I491 Atrial premature depolarization: Secondary | ICD-10-CM | POA: Insufficient documentation

## 2016-04-25 DIAGNOSIS — Z01812 Encounter for preprocedural laboratory examination: Secondary | ICD-10-CM | POA: Diagnosis not present

## 2016-04-25 DIAGNOSIS — Z0183 Encounter for blood typing: Secondary | ICD-10-CM | POA: Diagnosis not present

## 2016-04-25 DIAGNOSIS — Z01818 Encounter for other preprocedural examination: Secondary | ICD-10-CM | POA: Diagnosis not present

## 2016-04-25 HISTORY — DX: Chronic kidney disease, unspecified: N18.9

## 2016-04-25 HISTORY — DX: Respiratory tuberculosis unspecified: A15.9

## 2016-04-25 HISTORY — DX: Cardiac arrhythmia, unspecified: I49.9

## 2016-04-25 LAB — COMPREHENSIVE METABOLIC PANEL
ALBUMIN: 4.5 g/dL (ref 3.5–5.0)
ALT: 36 U/L (ref 17–63)
AST: 25 U/L (ref 15–41)
Alkaline Phosphatase: 57 U/L (ref 38–126)
Anion gap: 8 (ref 5–15)
BUN: 15 mg/dL (ref 6–20)
CHLORIDE: 106 mmol/L (ref 101–111)
CO2: 28 mmol/L (ref 22–32)
CREATININE: 0.86 mg/dL (ref 0.61–1.24)
Calcium: 9.6 mg/dL (ref 8.9–10.3)
GFR calc Af Amer: >60 mL/min (ref >60)
GFR calc non Af Amer: >60 mL/min (ref >60)
GLUCOSE: 102 mg/dL — AB (ref 65–99)
POTASSIUM: 4.1 mmol/L (ref 3.5–5.1)
SODIUM: 142 mmol/L (ref 135–145)
Total Bilirubin: 0.8 mg/dL (ref 0.3–1.2)
Total Protein: 6.9 g/dL (ref 6.5–8.1)

## 2016-04-25 LAB — URINALYSIS, ROUTINE W REFLEX MICROSCOPIC
BILIRUBIN URINE: NEGATIVE
Glucose, UA: NEGATIVE mg/dL
Hgb urine dipstick: NEGATIVE
KETONES UR: NEGATIVE mg/dL
LEUKOCYTES UA: NEGATIVE
NITRITE: NEGATIVE
PROTEIN: NEGATIVE mg/dL
Specific Gravity, Urine: 1.029 (ref 1.005–1.030)
pH: 6 (ref 5.0–8.0)

## 2016-04-25 LAB — APTT: aPTT: 24 seconds (ref 24–37)

## 2016-04-25 LAB — CBC
HEMATOCRIT: 40.8 % (ref 39.0–52.0)
Hemoglobin: 14.5 g/dL (ref 13.0–17.0)
MCH: 31.5 pg (ref 26.0–34.0)
MCHC: 35.5 g/dL (ref 30.0–36.0)
MCV: 88.5 fL (ref 78.0–100.0)
PLATELETS: 219 10*3/uL (ref 150–400)
RBC: 4.61 MIL/uL (ref 4.22–5.81)
RDW: 12.6 % (ref 11.5–15.5)
WBC: 6.9 10*3/uL (ref 4.0–10.5)

## 2016-04-25 LAB — SURGICAL PCR SCREEN
MRSA, PCR: NEGATIVE
STAPHYLOCOCCUS AUREUS: NEGATIVE

## 2016-04-25 LAB — PROTIME-INR
INR: 0.95 (ref 0.00–1.49)
Prothrombin Time: 12.9 seconds (ref 11.6–15.2)

## 2016-04-25 NOTE — Patient Instructions (Signed)
Russell Gillespie  04/25/2016   Your procedure is scheduled on: 05/03/16  Report to [facility] Main  Entrance take [facility]  elevators to 3rd floor to  Short Stay Center at 7:30 am  Call this number if you have problems the morning of surgery [phone]   Remember: ONLY 1 PERSON MAY GO WITH YOU TO SHORT STAY TO GET  READY MORNING OF YOUR SURGERY.  Do not eat food or drink liquids :After Midnight Tuesday     Take these medicines the morning of surgery with A SIP OF WATER: pain med if needed                                You may not have any metal on your body including body             piercings  Do not wear jewelry, lotions, powders or deodorant                        Men may shave face and neck.   Do not bring valuables to the hospital. [name] IS NOT             RESPONSIBLE   FOR VALUABLES.  Contacts, dentures or bridgework may not be worn into surgery.  Leave suitcase in the car. After surgery it may be brought to your room.       _____________________________________________________________________             [facility] - Preparing for Surgery Before surgery, you can play an important role.  Because skin is not sterile, your skin needs to be as free of germs as possible.  You can reduce the number of germs on your skin by washing with CHG (chlorahexidine gluconate) soap before surgery.  CHG is an antiseptic cleaner which kills germs and bonds with the skin to continue killing germs even after washing. Please DO NOT use if you have an allergy to CHG or antibacterial soaps.  If your skin becomes reddened/irritated stop using the CHG and inform your nurse when you arrive at Short Stay. Do not shave (including legs and underarms) for at least 48 hours prior to the first CHG shower.  You may shave your face/neck. Please follow these instructions carefully:  1.  Shower with CHG Soap the night before surgery and the  morning of Surgery.  2.  If  you choose to wash your hair, wash your hair first as usual with your  normal  shampoo.  3.  After you shampoo, rinse your hair and body thoroughly to remove the  shampoo.                           4.  Use CHG as you would any other liquid soap.  You can apply chg directly  to the skin and wash                       Gently with a scrungie or clean washcloth.  5.  Apply the CHG Soap to your body ONLY FROM THE NECK DOWN.   Do not use on face/ open  Wound or open sores. Avoid contact with eyes, ears mouth and genitals (private parts).                       Wash face,  Genitals (private parts) with your normal soap.             6.  Wash thoroughly, paying special attention to the area where your surgery  will be performed.  7.  Thoroughly rinse your body with warm water from the neck down.  8.  DO NOT shower/wash with your normal soap after using and rinsing off  the CHG Soap.                9.  Pat yourself dry with a clean towel.            10.  Wear clean pajamas.            11.  Place clean sheets on your bed the night of your first shower and do not  sleep with pets. Day of Surgery : Do not apply any lotions/deodorants the morning of surgery.  Please wear clean clothes to the hospital/surgery center.  FAILURE TO FOLLOW THESE INSTRUCTIONS MAY RESULT IN THE CANCELLATION OF YOUR SURGERY PATIENT SIGNATURE_________________________________  NURSE SIGNATURE__________________________________  ________________________________________________________________________   Russell Gillespie  An incentive spirometer is a tool that can help keep your lungs clear and active. This tool measures how well you are filling your lungs with each breath. Taking long deep breaths may help reverse or decrease the chance of developing breathing (pulmonary) problems (especially infection) following:  A long period of time when you are unable to move or be active. BEFORE THE PROCEDURE    If the spirometer includes an indicator to show your best effort, your nurse or respiratory therapist will set it to a desired goal.  If possible, sit up straight or lean slightly forward. Try not to slouch.  Hold the incentive spirometer in an upright position. INSTRUCTIONS FOR USE  1. Sit on the edge of your bed if possible, or sit up as far as you can in bed or on a chair. 2. Hold the incentive spirometer in an upright position. 3. Breathe out normally. 4. Place the mouthpiece in your mouth and seal your lips tightly around it. 5. Breathe in slowly and as deeply as possible, raising the piston or the ball toward the top of the column. 6. Hold your breath for 3-5 seconds or for as long as possible. Allow the piston or ball to fall to the bottom of the column. 7. Remove the mouthpiece from your mouth and breathe out normally. 8. Rest for a few seconds and repeat Steps 1 through 7 at least 10 times every 1-2 hours when you are awake. Take your time and take a few normal breaths between deep breaths. 9. The spirometer may include an indicator to show your best effort. Use the indicator as a goal to work toward during each repetition. 10. After each set of 10 deep breaths, practice coughing to be sure your lungs are clear. If you have an incision (the cut made at the time of surgery), support your incision when coughing by placing a pillow or rolled up towels firmly against it. Once you are able to get out of bed, walk around indoors and cough well. You may stop using the incentive spirometer when instructed by your caregiver.  RISKS AND COMPLICATIONS  Take your time so you do not get  dizzy or light-headed.  If you are in pain, you may need to take or ask for pain medication before doing incentive spirometry. It is harder to take a deep breath if you are having pain. AFTER USE  Rest and breathe slowly and easily.  It can be helpful to keep track of a log of your progress. Your caregiver  can provide you with a simple table to help with this. If you are using the spirometer at home, follow these instructions: SEEK MEDICAL CARE IF:   You are having difficultly using the spirometer.  You have trouble using the spirometer as often as instructed.  Your pain medication is not giving enough relief while using the spirometer.  You develop fever of 100.5 F (38.1 C) or higher. SEEK IMMEDIATE MEDICAL CARE IF:   You cough up bloody sputum that had not been present before.  You develop fever of 102 F (38.9 C) or greater.  You develop worsening pain at or near the incision site. MAKE SURE YOU:   Understand these instructions.  Will watch your condition.  Will get help right away if you are not doing well or get worse. Document Released: 04/09/2007 Document Revised: 02/19/2012 Document Reviewed: 06/10/2007 [employer] Patient Information 2014 [employer], [place].   ________________________________________________________________________   Incentive Spirometer  An incentive spirometer is a tool that can help keep your lungs clear and active. This tool measures how well you are filling your lungs with each breath. Taking long deep breaths may help reverse or decrease the chance of developing breathing (pulmonary) problems (especially infection) following:  A long period of time when you are unable to move or be active. BEFORE THE PROCEDURE   If the spirometer includes an indicator to show your best effort, your nurse or respiratory therapist will set it to a desired goal.  If possible, sit up straight or lean slightly forward. Try not to slouch.  Hold the incentive spirometer in an upright position. INSTRUCTIONS FOR USE  11. Sit on the edge of your bed if possible, or sit up as far as you can in bed or on a chair. 12. Hold the incentive spirometer in an upright position. 13. Breathe out normally. 14. Place the mouthpiece in your mouth and seal your lips tightly around  it. 15. Breathe in slowly and as deeply as possible, raising the piston or the ball toward the top of the column. 16. Hold your breath for 3-5 seconds or for as long as possible. Allow the piston or ball to fall to the bottom of the column. 17. Remove the mouthpiece from your mouth and breathe out normally. 18. Rest for a few seconds and repeat Steps 1 through 7 at least 10 times every 1-2 hours when you are awake. Take your time and take a few normal breaths between deep breaths. 19. The spirometer may include an indicator to show your best effort. Use the indicator as a goal to work toward during each repetition. 20. After each set of 10 deep breaths, practice coughing to be sure your lungs are clear. If you have an incision (the cut made at the time of surgery), support your incision when coughing by placing a pillow or rolled up towels firmly against it. Once you are able to get out of bed, walk around indoors and cough well. You may stop using the incentive spirometer when instructed by your caregiver.  RISKS AND COMPLICATIONS  Take your time so you do not get dizzy or light-headed.  If  you are in pain, you may need to take or ask for pain medication before doing incentive spirometry. It is harder to take a deep breath if you are having pain. AFTER USE  Rest and breathe slowly and easily.  It can be helpful to keep track of a log of your progress. Your caregiver can provide you with a simple table to help with this. If you are using the spirometer at home, follow these instructions: SEEK MEDICAL CARE IF:   You are having difficultly using the spirometer.  You have trouble using the spirometer as often as instructed.  Your pain medication is not giving enough relief while using the spirometer.  You develop fever of 100.5 F (38.1 C) or higher. SEEK IMMEDIATE MEDICAL CARE IF:   You cough up bloody sputum that had not been present before.  You develop fever of 102 F (38.9 C) or  greater.  You develop worsening pain at or near the incision site. MAKE SURE YOU:   Understand these instructions.  Will watch your condition.  Will get help right away if you are not doing well or get worse. Document Released: 04/09/2007 Document Revised: 02/19/2012 Document Reviewed: 06/10/2007 [facility] Patient Information 2014 [place], [place].   ________________________________________________________________________

## 2016-04-26 ENCOUNTER — Encounter (HOSPITAL_COMMUNITY): Payer: Self-pay

## 2016-05-02 ENCOUNTER — Ambulatory Visit: Payer: Self-pay | Admitting: Orthopedic Surgery

## 2016-05-02 NOTE — H&P (Signed)
Russell Gillespie DOB: [birth year] Married / Language: [place] / Race: White Male Date of Admission:  05/03/2016 CC:  Right Hip Pain History of Present Illness  The patient is a 55 year old male who comes in for a preoperative History and Physical. The patient is scheduled for a right total hip arthroplasty (anterior) to be performed by Dr. Gus Rankin. Aluisio, MD at [facility] on 05-01-2016. The patient is a 55 year old male who presented for follow up of their hip. The patient is being followed for their right hip pain. They are out from IA injection. Symptoms reported include: pain. The patient feels that they are doing poorly. The following medication has been used for pain control: Norco. Note for "Follow-up Hip": Patient states that the injection only lasted three weeks and then the pain returned worse than before. Unfortunately, his right hip is getting progressively worse. He had significant impingement morphology and subchondral cystic changes. He was not fully bone on bone, but had the above mentioned. His pain is getting progressively worse. It is having a very negative effect on his life. It is completely limiting what he can and cannot do. He is ready to get it replaced. They have been treated conservatively in the past for the above stated problem and despite conservative measures, they continue to have progressive pain and severe functional limitations and dysfunction. They have failed non-operative management including home exercise, medications, and injections. It is felt that they would benefit from undergoing total joint replacement. Risks and benefits of the procedure have been discussed with the patient and they elect to proceed with surgery. There are no active contraindications to surgery such as ongoing infection or rapidly progressive neurological disease.   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 former smoker; smoke(d) 1/2 pack(s) per day Alcohol use  current drinker; drinks beer; less than 5 per week Drug/Alcohol Rehab (Currently)  no Current work status  working full time Living situation  live with spouse Marital status  married Number of flights of stairs before winded  greater than 5 Illicit drug use  no Pain Contract  no Tobacco / smoke exposure  yes outdoors only  Medication History Lipitor (  Tablet, Oral) Active. Flonase Allergy Relief (50MCG/ACT Suspension, Nasal) Active. Aspirin (  Tablet Chewable, Oral) Active. Aleve (  Tablet, Oral PRN) Active. Tylenol (  Tablet, Oral PRN) Active. TraMADol HCl (  Tablet, Oral PRN) Active.  Past Surgical History  Total Knee Replacement  left Vasectomy  Spinal Surgery     Review of Systems General Not Present- Chills, Fatigue, Fever, Memory Loss, Night Sweats, Weight Gain and Weight Loss. Skin Not Present- Eczema, Hives, Itching, Lesions and Rash. HEENT Not Present- Dentures, Double Vision, Headache, Hearing Loss, Tinnitus and Visual Loss. Respiratory Not Present- Allergies, Chronic Cough, Coughing up blood, Shortness of breath at rest and Shortness of breath with exertion. Cardiovascular Not Present-  Chest Pain, Difficulty Breathing Lying Down, Murmur, Palpitations,  Racing/skipping heartbeats and Swelling. Gastrointestinal Not Present- Abdominal Pain, Bloody Stool, Constipation, Diarrhea, Difficulty Swallowing, Heartburn, Jaundice, Loss of appetitie, Nausea and Vomiting. Male Genitourinary Not Present- Blood in Urine, Discharge, Flank Pain, Incontinence, Painful Urination, Urgency, Urinary frequency, Urinary Retention, Urinating at Night and Weak urinary stream. Musculoskeletal Present- Joint Pain. Not Present- Back Pain, Joint Swelling, Morning Stiffness, Muscle Pain, Muscle Weakness and Spasms. Neurological Not Present- Blackout spells, Difficulty with balance, Dizziness, Paralysis, Tremor and Weakness. Psychiatric Not Present- Insomnia.  Vitals  Weight: 225 lb Height: 72in Body Surface Area: 2.24 m Body Mass Index: 30.52 kg/m  Pulse: 64 (Regular)  BP: 150/84 (Sitting, Left Arm, Standard)       Physical Exam  General Mental Status -Alert, cooperative and good historian. General Appearance-pleasant, Not in acute distress. Orientation-Oriented X3. Build & Nutrition-Well nourished and Well developed.  Head and Neck Head-normocephalic, atraumatic . Neck Global Assessment - supple, no bruit auscultated on the right, no bruit auscultated on the left.  Eye Vision-Wears corrective lenses. Pupil - Bilateral-Regular and Round. Motion - Bilateral-EOMI.  Chest and Lung Exam Auscultation Breath sounds - clear at anterior chest wall and clear at posterior chest wall. Adventitious sounds - No Adventitious sounds.  Cardiovascular Auscultation Rhythm - Regular rate and rhythm. Heart Sounds - S1 WNL and S2 WNL. Murmurs & Other Heart Sounds - Auscultation of the heart reveals - No Murmurs.  Abdomen Palpation/Percussion Tenderness - Abdomen is non-tender to palpation. Rigidity (guarding) - Abdomen is soft. Auscultation Auscultation of the abdomen reveals - Bowel sounds normal.  Male Genitourinary Note: Not done, not  pertinent to present illness   Musculoskeletal Note: He is in no distress. His left hip has normal range of motion with no discomfort. His right hip can be flexed to about 100, minimal internal rotation about 10 to 20, external rotation, 20 abduction.   Assessment & Plan Primary osteoarthritis of right hip (M16.11)  Note:Surgical Plans: Right Total Hip Replacement - Anterior Approach  Disposition: Home  PCP: Dr. Azucena CecilSwayne - Patient has been seen preoperatively and felt to be stable for surgery.  IV TXA  Anesthesia Issues: None  Signed electronically by Beckey RutterAlezandrew L Timonthy Hovater, III PA-C

## 2016-05-03 ENCOUNTER — Inpatient Hospital Stay (HOSPITAL_COMMUNITY): Payer: Managed Care, Other (non HMO)

## 2016-05-03 ENCOUNTER — Encounter (HOSPITAL_COMMUNITY)
Admission: RE | Disposition: A | Discharge status: Home Health | Payer: Self-pay | Source: Ambulatory Visit | Attending: Orthopedic Surgery

## 2016-05-03 ENCOUNTER — Inpatient Hospital Stay (HOSPITAL_COMMUNITY)
Admission: RE | Admit: 2016-05-03 | Discharge: 2016-05-04 | DRG: 470 | Disposition: A | Discharge status: Home Health | Payer: Managed Care, Other (non HMO) | Source: Ambulatory Visit | Attending: Orthopedic Surgery | Admitting: Orthopedic Surgery

## 2016-05-03 ENCOUNTER — Inpatient Hospital Stay (HOSPITAL_COMMUNITY): Payer: Managed Care, Other (non HMO) | Admitting: Anesthesiology

## 2016-05-03 ENCOUNTER — Encounter (HOSPITAL_COMMUNITY): Payer: Self-pay | Admitting: *Deleted

## 2016-05-03 DIAGNOSIS — Z8261 Family history of arthritis: Secondary | ICD-10-CM

## 2016-05-03 DIAGNOSIS — M1611 Unilateral primary osteoarthritis, right hip: Secondary | ICD-10-CM | POA: Diagnosis present

## 2016-05-03 DIAGNOSIS — Z7982 Long term (current) use of aspirin: Secondary | ICD-10-CM | POA: Diagnosis not present

## 2016-05-03 DIAGNOSIS — Z96652 Presence of left artificial knee joint: Secondary | ICD-10-CM | POA: Diagnosis present

## 2016-05-03 DIAGNOSIS — Z96649 Presence of unspecified artificial hip joint: Secondary | ICD-10-CM

## 2016-05-03 DIAGNOSIS — Z87891 Personal history of nicotine dependence: Secondary | ICD-10-CM

## 2016-05-03 DIAGNOSIS — Z79899 Other long term (current) drug therapy: Secondary | ICD-10-CM

## 2016-05-03 DIAGNOSIS — M25551 Pain in right hip: Secondary | ICD-10-CM | POA: Diagnosis present

## 2016-05-03 DIAGNOSIS — M169 Osteoarthritis of hip, unspecified: Secondary | ICD-10-CM | POA: Diagnosis present

## 2016-05-03 HISTORY — PX: TOTAL HIP ARTHROPLASTY: SHX124

## 2016-05-03 LAB — TYPE AND SCREEN
ABO/RH(D): A POS
ANTIBODY SCREEN: NEGATIVE

## 2016-05-03 SURGERY — ARTHROPLASTY, HIP, TOTAL, ANTERIOR APPROACH
Anesthesia: Spinal | Site: Hip | Laterality: Right

## 2016-05-03 MED ORDER — CEFAZOLIN SODIUM-DEXTROSE 2-4 GM/100ML-% IV SOLN
2.0000 g | INTRAVENOUS | Status: AC
  Administered 2016-05-03: 2 g via INTRAVENOUS
  Filled 2016-05-03: qty 100

## 2016-05-03 MED ORDER — DEXAMETHASONE SODIUM PHOSPHATE 10 MG/ML IJ SOLN
INTRAMUSCULAR | Status: AC
  Filled 2016-05-03: qty 1

## 2016-05-03 MED ORDER — BUPIVACAINE HCL (PF) 0.25 % IJ SOLN
INTRAMUSCULAR | Status: AC
  Filled 2016-05-03: qty 30

## 2016-05-03 MED ORDER — PROPOFOL 10 MG/ML IV BOLUS
INTRAVENOUS | Status: DC | PRN
  Administered 2016-05-03: 30 mg via INTRAVENOUS
  Administered 2016-05-03: 40 mg via INTRAVENOUS
  Administered 2016-05-03 (×3): 30 mg via INTRAVENOUS

## 2016-05-03 MED ORDER — CEFAZOLIN SODIUM-DEXTROSE 2-4 GM/100ML-% IV SOLN
2.0000 g | Freq: Four times a day (QID) | INTRAVENOUS | Status: AC
  Administered 2016-05-03 (×2): 2 g via INTRAVENOUS
  Filled 2016-05-03 (×2): qty 100

## 2016-05-03 MED ORDER — DIPHENHYDRAMINE HCL 12.5 MG/5ML PO ELIX: 12.5000 mg | ORAL_SOLUTION | ORAL | Status: DC | PRN

## 2016-05-03 MED ORDER — SODIUM CHLORIDE 0.9 % IV SOLN: INTRAVENOUS | Status: DC

## 2016-05-03 MED ORDER — ACETAMINOPHEN 500 MG PO TABS
1000.0000 mg | ORAL_TABLET | Freq: Four times a day (QID) | ORAL | Status: AC
  Administered 2016-05-03 – 2016-05-04 (×4): 1000 mg via ORAL
  Filled 2016-05-03 (×5): qty 2

## 2016-05-03 MED ORDER — SODIUM CHLORIDE 0.9 % IJ SOLN
INTRAMUSCULAR | Status: AC
  Filled 2016-05-03: qty 10

## 2016-05-03 MED ORDER — PHENOL 1.4 % MT LIQD: 1.0000 | OROMUCOSAL | Status: DC | PRN

## 2016-05-03 MED ORDER — SODIUM CHLORIDE 0.9 % IV SOLN
1000.0000 mg | INTRAVENOUS | Status: AC
  Administered 2016-05-03: 1000 mg via INTRAVENOUS
  Filled 2016-05-03: qty 10

## 2016-05-03 MED ORDER — DEXAMETHASONE SODIUM PHOSPHATE 10 MG/ML IJ SOLN
10.0000 mg | Freq: Once | INTRAMUSCULAR | Status: AC
  Administered 2016-05-03: 10 mg via INTRAVENOUS

## 2016-05-03 MED ORDER — RIVAROXABAN 10 MG PO TABS
10.0000 mg | ORAL_TABLET | Freq: Every day | ORAL | Status: DC
  Administered 2016-05-04: 10 mg via ORAL
  Filled 2016-05-03: qty 1

## 2016-05-03 MED ORDER — EPHEDRINE SULFATE 50 MG/ML IJ SOLN
INTRAMUSCULAR | Status: DC | PRN
  Administered 2016-05-03: 5 mg via INTRAVENOUS

## 2016-05-03 MED ORDER — LISDEXAMFETAMINE DIMESYLATE 50 MG PO CAPS
50.0000 mg | ORAL_CAPSULE | Freq: Every day | ORAL | Status: DC
  Administered 2016-05-04: 50 mg via ORAL
  Filled 2016-05-03: qty 1

## 2016-05-03 MED ORDER — PROPOFOL 10 MG/ML IV BOLUS
INTRAVENOUS | Status: AC
  Filled 2016-05-03: qty 40

## 2016-05-03 MED ORDER — ACETAMINOPHEN 325 MG PO TABS: 650.0000 mg | ORAL_TABLET | Freq: Four times a day (QID) | ORAL | Status: DC | PRN

## 2016-05-03 MED ORDER — METHOCARBAMOL 1000 MG/10ML IJ SOLN
500.0000 mg | Freq: Four times a day (QID) | INTRAVENOUS | Status: DC | PRN
  Administered 2016-05-03: 500 mg via INTRAVENOUS
  Filled 2016-05-03: qty 5
  Filled 2016-05-03: qty 550

## 2016-05-03 MED ORDER — ACETAMINOPHEN 650 MG RE SUPP: 650.0000 mg | Freq: Four times a day (QID) | RECTAL | Status: DC | PRN

## 2016-05-03 MED ORDER — FENTANYL CITRATE (PF) 100 MCG/2ML IJ SOLN
INTRAMUSCULAR | Status: DC | PRN
  Administered 2016-05-03 (×2): 50 ug via INTRAVENOUS

## 2016-05-03 MED ORDER — OXYCODONE HCL 5 MG PO TABS: 5.0000 mg | ORAL_TABLET | Freq: Once | ORAL | Status: DC | PRN

## 2016-05-03 MED ORDER — ACETAMINOPHEN 10 MG/ML IV SOLN
1000.0000 mg | Freq: Once | INTRAVENOUS | Status: AC
  Administered 2016-05-03: 1000 mg via INTRAVENOUS

## 2016-05-03 MED ORDER — METOCLOPRAMIDE HCL 5 MG PO TABS: 5.0000 mg | ORAL_TABLET | Freq: Three times a day (TID) | ORAL | Status: DC | PRN

## 2016-05-03 MED ORDER — CHLORHEXIDINE GLUCONATE 4 % EX LIQD: 60.0000 mL | Freq: Once | CUTANEOUS | Status: DC

## 2016-05-03 MED ORDER — PROPOFOL 10 MG/ML IV BOLUS
INTRAVENOUS | Status: AC
  Filled 2016-05-03: qty 20

## 2016-05-03 MED ORDER — FLUTICASONE PROPIONATE 50 MCG/ACT NA SUSP: 1.0000 | Freq: Every day | NASAL | Status: DC | PRN

## 2016-05-03 MED ORDER — TRAMADOL HCL 50 MG PO TABS: 50.0000 mg | ORAL_TABLET | Freq: Four times a day (QID) | ORAL | Status: DC | PRN

## 2016-05-03 MED ORDER — OXYCODONE HCL 5 MG/5ML PO SOLN
5.0000 mg | Freq: Once | ORAL | Status: DC | PRN
  Filled 2016-05-03: qty 5

## 2016-05-03 MED ORDER — METHOCARBAMOL 500 MG PO TABS
500.0000 mg | ORAL_TABLET | Freq: Four times a day (QID) | ORAL | Status: DC | PRN
  Administered 2016-05-04: 500 mg via ORAL
  Filled 2016-05-03: qty 1

## 2016-05-03 MED ORDER — MENTHOL 3 MG MT LOZG: 1.0000 | LOZENGE | OROMUCOSAL | Status: DC | PRN

## 2016-05-03 MED ORDER — KETOROLAC TROMETHAMINE 15 MG/ML IJ SOLN
7.5000 mg | Freq: Four times a day (QID) | INTRAMUSCULAR | Status: AC | PRN
  Administered 2016-05-03: 7.5 mg via INTRAVENOUS
  Filled 2016-05-03: qty 1

## 2016-05-03 MED ORDER — HYDROMORPHONE HCL 1 MG/ML IJ SOLN
0.2500 mg | INTRAMUSCULAR | Status: DC | PRN
  Administered 2016-05-03 (×2): 0.5 mg via INTRAVENOUS

## 2016-05-03 MED ORDER — MORPHINE SULFATE (PF) 2 MG/ML IV SOLN
1.0000 mg | INTRAVENOUS | Status: DC | PRN
  Administered 2016-05-03 (×2): 1 mg via INTRAVENOUS
  Filled 2016-05-03 (×2): qty 1

## 2016-05-03 MED ORDER — METOCLOPRAMIDE HCL 5 MG/ML IJ SOLN: 5.0000 mg | Freq: Three times a day (TID) | INTRAMUSCULAR | Status: DC | PRN

## 2016-05-03 MED ORDER — BISACODYL 10 MG RE SUPP: 10.0000 mg | Freq: Every day | RECTAL | Status: DC | PRN

## 2016-05-03 MED ORDER — FLEET ENEMA 7-19 GM/118ML RE ENEM: 1.0000 | ENEMA | Freq: Once | RECTAL | Status: DC | PRN

## 2016-05-03 MED ORDER — LACTATED RINGERS IV SOLN
INTRAVENOUS | Status: DC
  Administered 2016-05-03: 1000 mL via INTRAVENOUS
  Administered 2016-05-03 (×2): via INTRAVENOUS

## 2016-05-03 MED ORDER — ONDANSETRON HCL 4 MG/2ML IJ SOLN
INTRAMUSCULAR | Status: DC | PRN
  Administered 2016-05-03: 4 mg via INTRAVENOUS

## 2016-05-03 MED ORDER — ONDANSETRON HCL 4 MG/2ML IJ SOLN
INTRAMUSCULAR | Status: AC
  Filled 2016-05-03: qty 2

## 2016-05-03 MED ORDER — DOCUSATE SODIUM 100 MG PO CAPS
100.0000 mg | ORAL_CAPSULE | Freq: Two times a day (BID) | ORAL | Status: DC
  Administered 2016-05-03 – 2016-05-04 (×2): 100 mg via ORAL
  Filled 2016-05-03 (×3): qty 1

## 2016-05-03 MED ORDER — TRANEXAMIC ACID 1000 MG/10ML IV SOLN
1000.0000 mg | Freq: Once | INTRAVENOUS | Status: AC
  Administered 2016-05-03: 1000 mg via INTRAVENOUS
  Filled 2016-05-03: qty 10

## 2016-05-03 MED ORDER — HYDROMORPHONE HCL 1 MG/ML IJ SOLN
INTRAMUSCULAR | Status: AC
  Filled 2016-05-03: qty 1

## 2016-05-03 MED ORDER — SODIUM CHLORIDE 0.9 % IV SOLN
INTRAVENOUS | Status: DC
  Administered 2016-05-03 – 2016-05-04 (×2): via INTRAVENOUS

## 2016-05-03 MED ORDER — BUPIVACAINE HCL (PF) 0.5 % IJ SOLN
INTRAMUSCULAR | Status: DC | PRN
  Administered 2016-05-03: 2.5 mL via INTRATHECAL

## 2016-05-03 MED ORDER — POLYETHYLENE GLYCOL 3350 17 G PO PACK: 17.0000 g | PACK | Freq: Every day | ORAL | Status: DC | PRN

## 2016-05-03 MED ORDER — DEXAMETHASONE SODIUM PHOSPHATE 10 MG/ML IJ SOLN
10.0000 mg | Freq: Once | INTRAMUSCULAR | Status: AC
  Administered 2016-05-04: 10 mg via INTRAVENOUS
  Filled 2016-05-03: qty 1

## 2016-05-03 MED ORDER — MIDAZOLAM HCL 2 MG/2ML IJ SOLN
INTRAMUSCULAR | Status: AC
  Filled 2016-05-03: qty 2

## 2016-05-03 MED ORDER — ONDANSETRON HCL 4 MG PO TABS: 4.0000 mg | ORAL_TABLET | Freq: Four times a day (QID) | ORAL | Status: DC | PRN

## 2016-05-03 MED ORDER — CEFAZOLIN SODIUM-DEXTROSE 2-4 GM/100ML-% IV SOLN
INTRAVENOUS | Status: AC
  Filled 2016-05-03: qty 100

## 2016-05-03 MED ORDER — EPHEDRINE SULFATE 50 MG/ML IJ SOLN
INTRAMUSCULAR | Status: AC
  Filled 2016-05-03: qty 1

## 2016-05-03 MED ORDER — FENTANYL CITRATE (PF) 100 MCG/2ML IJ SOLN
INTRAMUSCULAR | Status: AC
  Filled 2016-05-03: qty 2

## 2016-05-03 MED ORDER — ONDANSETRON HCL 4 MG/2ML IJ SOLN: 4.0000 mg | Freq: Four times a day (QID) | INTRAMUSCULAR | Status: DC | PRN

## 2016-05-03 MED ORDER — PROPOFOL 500 MG/50ML IV EMUL
INTRAVENOUS | Status: DC | PRN
  Administered 2016-05-03: 100 ug/kg/min via INTRAVENOUS

## 2016-05-03 MED ORDER — ACETAMINOPHEN 10 MG/ML IV SOLN
INTRAVENOUS | Status: AC
  Filled 2016-05-03: qty 100

## 2016-05-03 MED ORDER — ONDANSETRON HCL 4 MG/2ML IJ SOLN: 4.0000 mg | Freq: Once | INTRAMUSCULAR | Status: DC | PRN

## 2016-05-03 MED ORDER — BUPIVACAINE HCL (PF) 0.25 % IJ SOLN
INTRAMUSCULAR | Status: DC | PRN
  Administered 2016-05-03: 30 mL

## 2016-05-03 MED ORDER — MIDAZOLAM HCL 5 MG/5ML IJ SOLN
INTRAMUSCULAR | Status: DC | PRN
  Administered 2016-05-03: 2 mg via INTRAVENOUS

## 2016-05-03 MED ORDER — OXYCODONE HCL 5 MG PO TABS
5.0000 mg | ORAL_TABLET | ORAL | Status: DC | PRN
  Administered 2016-05-03 (×2): 10 mg via ORAL
  Administered 2016-05-03: 5 mg via ORAL
  Administered 2016-05-04 (×5): 10 mg via ORAL
  Filled 2016-05-03 (×5): qty 2
  Filled 2016-05-03: qty 1
  Filled 2016-05-03 (×2): qty 2

## 2016-05-03 SURGICAL SUPPLY — 34 items
BAG DECANTER FOR FLEXI CONT (MISCELLANEOUS) ×3 IMPLANT
BAG ZIPLOCK 12X15 (MISCELLANEOUS) IMPLANT
BLADE SAG 18X100X1.27 (BLADE) ×3 IMPLANT
CAPT HIP TOTAL 2 ×3 IMPLANT
CLOSURE WOUND 1/2 X4 (GAUZE/BANDAGES/DRESSINGS) ×1
CLOTH BEACON ORANGE TIMEOUT ST (SAFETY) ×3 IMPLANT
COVER PERINEAL POST (MISCELLANEOUS) ×3 IMPLANT
DECANTER SPIKE VIAL GLASS SM (MISCELLANEOUS) ×3 IMPLANT
DRAPE STERI IOBAN 125X83 (DRAPES) ×3 IMPLANT
DRAPE U-SHAPE 47X51 STRL (DRAPES) ×6 IMPLANT
DRSG ADAPTIC 3X8 NADH LF (GAUZE/BANDAGES/DRESSINGS) ×3 IMPLANT
DRSG MEPILEX BORDER 4X4 (GAUZE/BANDAGES/DRESSINGS) ×3 IMPLANT
DRSG MEPILEX BORDER 4X8 (GAUZE/BANDAGES/DRESSINGS) ×3 IMPLANT
DURAPREP 26ML APPLICATOR (WOUND CARE) ×3 IMPLANT
ELECT REM PT RETURN 9FT ADLT (ELECTROSURGICAL) ×3
ELECTRODE REM PT RTRN 9FT ADLT (ELECTROSURGICAL) ×1 IMPLANT
EVACUATOR 1/8 PVC DRAIN (DRAIN) ×3 IMPLANT
GLOVE BIO SURGEON STRL SZ7.5 (GLOVE) ×3 IMPLANT
GLOVE BIO SURGEON STRL SZ8 (GLOVE) ×6 IMPLANT
GLOVE BIOGEL PI IND STRL 8 (GLOVE) ×2 IMPLANT
GLOVE BIOGEL PI INDICATOR 8 (GLOVE) ×4
GOWN STRL REUS W/TWL LRG LVL3 (GOWN DISPOSABLE) ×3 IMPLANT
GOWN STRL REUS W/TWL XL LVL3 (GOWN DISPOSABLE) ×3 IMPLANT
PACK ANTERIOR HIP CUSTOM (KITS) ×3 IMPLANT
STRIP CLOSURE SKIN 1/2X4 (GAUZE/BANDAGES/DRESSINGS) ×2 IMPLANT
SUT ETHIBOND NAB CT1 #1 30IN (SUTURE) ×3 IMPLANT
SUT MNCRL AB 4-0 PS2 18 (SUTURE) ×3 IMPLANT
SUT VIC AB 2-0 CT1 27 (SUTURE) ×4
SUT VIC AB 2-0 CT1 TAPERPNT 27 (SUTURE) ×2 IMPLANT
SUT VLOC 180 0 24IN GS25 (SUTURE) ×3 IMPLANT
SYR 50ML LL SCALE MARK (SYRINGE) IMPLANT
TRAY FOLEY W/METER SILVER 14FR (SET/KITS/TRAYS/PACK) IMPLANT
TRAY FOLEY W/METER SILVER 16FR (SET/KITS/TRAYS/PACK) ×3 IMPLANT
YANKAUER SUCT BULB TIP 10FT TU (MISCELLANEOUS) ×3 IMPLANT

## 2016-05-03 NOTE — Anesthesia Procedure Notes (Signed)
Spinal Patient location during procedure: OR Start time: 05/03/2016 9:26 AM End time: 05/03/2016 9:30 AM Staffing Resident/CRNA: Anne Fu Performed by: resident/CRNA  Preanesthetic Checklist Completed: patient identified, site marked, surgical consent, pre-op evaluation, timeout performed, IV checked, risks and benefits discussed and monitors and equipment checked Spinal Block Patient position: sitting Prep: DuraPrep Patient monitoring: heart rate, continuous pulse ox and blood pressure Location: L3-4 Injection technique: single-shot Needle Needle type: Quincke  Needle gauge: 25 G Needle length: 9 cm Assessment Sensory level: T6 Additional Notes Expiration date of kit checked and confirmed. Patient tolerated procedure well, without complications.

## 2016-05-03 NOTE — Anesthesia Preprocedure Evaluation (Addendum)
Anesthesia Evaluation  Patient identified by MRN, date of birth, ID band Patient awake    Reviewed: Allergy & Precautions, H&P , NPO status , Patient's Chart, lab work & pertinent test results  History of Anesthesia Complications Negative for: history of anesthetic complications  Airway Mallampati: II  TM Distance: >3 FB Neck ROM: full    Dental no notable dental hx.    Pulmonary former smoker,    Pulmonary exam normal breath sounds clear to auscultation       Cardiovascular Normal cardiovascular exam+ dysrhythmias  Rhythm:regular Rate:Normal  Has PACs on EKG   Neuro/Psych negative neurological ROS     GI/Hepatic negative GI ROS, Neg liver ROS,   Endo/Other  negative endocrine ROS  Renal/GU      Musculoskeletal  (+) Arthritis ,   Abdominal   Peds  Hematology negative hematology ROS (+)   Anesthesia Other Findings   Reproductive/Obstetrics negative OB ROS                           Anesthesia Physical Anesthesia Plan  ASA: II  Anesthesia Plan: Spinal   Post-op Pain Management:    Induction: Intravenous  Airway Management Planned: Simple Face Mask  Additional Equipment:   Intra-op Plan:   Post-operative Plan:   Informed Consent: I have reviewed the patients History and Physical, chart, labs and discussed the procedure including the risks, benefits and alternatives for the proposed anesthesia with the patient or authorized representative who has indicated his/her understanding and acceptance.   Dental Advisory Given  Plan Discussed with: Anesthesiologist, CRNA and Surgeon  Anesthesia Plan Comments:         Anesthesia Quick Evaluation

## 2016-05-03 NOTE — H&P (View-Only) (Signed)
Russell Gillespie DOB: 08/01/1961 Married / Language: English / Race: White Male Date of Admission:  05/03/2016 CC:  Right Hip Pain History of Present Illness  The patient is a 55 year old male who comes in for a preoperative History and Physical. The patient is scheduled for a right total hip arthroplasty (anterior) to be performed by Dr. Frank V. Aluisio, MD at [facility] on 05-01-2016. The patient is a 55 year old male who presented for follow up of their hip. The patient is being followed for their right hip pain. They are out from IA injection. Symptoms reported include: pain. The patient feels that they are doing poorly. The following medication has been used for pain control: Norco. Note for "Follow-up Hip": Patient states that the injection only lasted three weeks and then the pain returned worse than before. Unfortunately, his right hip is getting progressively worse. He had significant impingement morphology and subchondral cystic changes. He was not fully bone on bone, but had the above mentioned. His pain is getting progressively worse. It is having a very negative effect on his life. It is completely limiting what he can and cannot do. He is ready to get it replaced. They have been treated conservatively in the past for the above stated problem and despite conservative measures, they continue to have progressive pain and severe functional limitations and dysfunction. They have failed non-operative management including home exercise, medications, and injections. It is felt that they would benefit from undergoing total joint replacement. Risks and benefits of the procedure have been discussed with the patient and they elect to proceed with surgery. There are no active contraindications to surgery such as ongoing infection or rapidly progressive neurological disease.   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 former smoker; smoke(d) 1/2 pack(s) per day Alcohol use  current drinker; drinks beer; less than 5 per week Drug/Alcohol Rehab (Currently)  no Current work status  working full time Living situation  live with spouse Marital status  married Number of flights of stairs before winded  greater than 5 Illicit drug use  no Pain Contract  no Tobacco / smoke exposure  yes outdoors only  Medication History Lipitor (10MG Tablet, Oral) Active. Flonase Allergy Relief (50MCG/ACT Suspension, Nasal) Active. Aspirin (81MG Tablet Chewable, Oral) Active. Aleve (220MG Tablet, Oral PRN) Active. Tylenol (325MG Tablet, Oral PRN) Active. TraMADol HCl (50MG Tablet, Oral PRN) Active.  Past Surgical History  Total Knee Replacement  left Vasectomy  Spinal Surgery     Review of Systems General Not Present- Chills, Fatigue, Fever, Memory Loss, Night Sweats, Weight Gain and Weight Loss. Skin Not Present- Eczema, Hives, Itching, Lesions and Rash. HEENT Not Present- Dentures, Double Vision, Headache, Hearing Loss, Tinnitus and Visual Loss. Respiratory Not Present- Allergies, Chronic Cough, Coughing up blood, Shortness of breath at rest and Shortness of breath with exertion. Cardiovascular Not Present-   Chest Pain, Difficulty Breathing Lying Down, Murmur, Palpitations,  Racing/skipping heartbeats and Swelling. Gastrointestinal Not Present- Abdominal Pain, Bloody Stool, Constipation, Diarrhea, Difficulty Swallowing, Heartburn, Jaundice, Loss of appetitie, Nausea and Vomiting. Male Genitourinary Not Present- Blood in Urine, Discharge, Flank Pain, Incontinence, Painful Urination, Urgency, Urinary frequency, Urinary Retention, Urinating at Night and Weak urinary stream. Musculoskeletal Present- Joint Pain. Not Present- Back Pain, Joint Swelling, Morning Stiffness, Muscle Pain, Muscle Weakness and Spasms. Neurological Not Present- Blackout spells, Difficulty with balance, Dizziness, Paralysis, Tremor and Weakness. Psychiatric Not Present- Insomnia.  Vitals  Weight: 225 lb Height: 72in Body Surface Area: 2.24 m Body Mass Index: 30.52 kg/m  Pulse: 64 (Regular)  BP: 150/84 (Sitting, Left Arm, Standard)       Physical Exam  General Mental Status -Alert, cooperative and good historian. General Appearance-pleasant, Not in acute distress. Orientation-Oriented X3. Build & Nutrition-Well nourished and Well developed.  Head and Neck Head-normocephalic, atraumatic . Neck Global Assessment - supple, no bruit auscultated on the right, no bruit auscultated on the left.  Eye Vision-Wears corrective lenses. Pupil - Bilateral-Regular and Round. Motion - Bilateral-EOMI.  Chest and Lung Exam Auscultation Breath sounds - clear at anterior chest wall and clear at posterior chest wall. Adventitious sounds - No Adventitious sounds.  Cardiovascular Auscultation Rhythm - Regular rate and rhythm. Heart Sounds - S1 WNL and S2 WNL. Murmurs & Other Heart Sounds - Auscultation of the heart reveals - No Murmurs.  Abdomen Palpation/Percussion Tenderness - Abdomen is non-tender to palpation. Rigidity (guarding) - Abdomen is soft. Auscultation Auscultation of the abdomen reveals - Bowel sounds normal.  Male Genitourinary Note: Not done, not  pertinent to present illness   Musculoskeletal Note: He is in no distress. His left hip has normal range of motion with no discomfort. His right hip can be flexed to about 100, minimal internal rotation about 10 to 20, external rotation, 20 abduction.   Assessment & Plan Primary osteoarthritis of right hip (M16.11)  Note:Surgical Plans: Right Total Hip Replacement - Anterior Approach  Disposition: Home  PCP: Dr. Azucena CecilSwayne - Patient has been seen preoperatively and felt to be stable for surgery.  IV TXA  Anesthesia Issues: None  Signed electronically by Beckey RutterAlezandrew L Saaya Procell, III PA-C

## 2016-05-03 NOTE — Anesthesia Postprocedure Evaluation (Signed)
Anesthesia Post Note  Patient: Russell Gillespie  Procedure(s) Performed: Procedure(s) (LRB): RIGHT TOTAL HIP ARTHROPLASTY ANTERIOR APPROACH (Right)  Patient location during evaluation: PACU Anesthesia Type: Spinal Level of consciousness: oriented and awake and alert Pain management: pain level controlled Vital Signs Assessment: post-procedure vital signs reviewed and stable Respiratory status: spontaneous breathing, respiratory function stable and patient connected to nasal cannula oxygen Cardiovascular status: blood pressure returned to baseline and stable Postop Assessment: no headache, no backache and spinal receding Anesthetic complications: no    Last Vitals:  Filed Vitals:   05/03/16 1500 05/03/16 1623  BP: 115/74 107/61  Pulse: 72 69  Temp: 36.4 C 36.3 C  Resp: 16 15    Last Pain:  Filed Vitals:   05/03/16 1632  PainSc: 4                  Reino KentJudd, Zyden Suman J

## 2016-05-03 NOTE — Progress Notes (Signed)
Utilization review completed.  

## 2016-05-03 NOTE — Interval H&P Note (Signed)
History and Physical Interval Note:  05/03/2016 8:39 AM  Russell Gillespie  has presented today for surgery, with the diagnosis of RIGHT HIP OA  The various methods of treatment have been discussed with the patient and family. After consideration of risks, benefits and other options for treatment, the patient has consented to  Procedure(s): RIGHT TOTAL HIP ARTHROPLASTY ANTERIOR APPROACH (Right) as a surgical intervention .  The patient's history has been reviewed, patient examined, no change in status, stable for surgery.  I have reviewed the patient's chart and labs.  Questions were answered to the patient's satisfaction.     Loanne DrillingALUISIO,Welford Christmas V

## 2016-05-03 NOTE — Transfer of Care (Signed)
Immediate Anesthesia Transfer of Care Note  Patient: Russell Gillespie  Procedure(s) Performed: Procedure(s): RIGHT TOTAL HIP ARTHROPLASTY ANTERIOR APPROACH (Right)  Patient Location: PACU  Anesthesia Type:Spinal  Level of Consciousness: awake, alert , oriented and patient cooperative  Airway & Oxygen Therapy: Patient Spontanous Breathing and Patient connected to face mask oxygen  Post-op Assessment: Report given to RN and Post -op Vital signs reviewed and stable  Post vital signs: Reviewed and stable  Last Vitals:  Filed Vitals:   05/03/16 0733  BP: 121/77  Pulse: 63  Temp: 36.6 C  Resp: 18    Last Pain:  Filed Vitals:   05/03/16 0904  PainSc: 3       Patients Stated Pain Goal: 4 (05/03/16 0903)  Complications: No apparent anesthesia complications

## 2016-05-03 NOTE — Op Note (Signed)
OPERATIVE REPORT  PREOPERATIVE DIAGNOSIS: Osteoarthritis of the Right hip.   POSTOPERATIVE DIAGNOSIS: Osteoarthritis of the Right  hip.   PROCEDURE: Right total hip arthroplasty, anterior approach.   SURGEON: Ollen GrossFrank Nazyia Gaugh, MD   ASSISTANT: Avel Peacerew Perkins, PA-C  ANESTHESIA:  Spinal  ESTIMATED BLOOD LOSS:-600 ml    DRAINS: Hemovac x1.   COMPLICATIONS: None   CONDITION: PACU - hemodynamically stable.   BRIEF CLINICAL NOTE: Russell Gillespie is a 55 y.o. male who has advanced end-  stage arthritis of his Right  hip with progressively worsening pain and  dysfunction.The patient has failed nonoperative management and presents for  total hip arthroplasty.   PROCEDURE IN DETAIL: After successful administration of spinal  anesthetic, the traction boots for the [facility] bed were placed on both  feet and the [facility], boots placed into the leg  holders. The Right hip was then isolated from the perineum with plastic  drapes and prepped and draped in the usual sterile fashion. ASIS and  greater trochanter were marked and a oblique incision was made, starting  at about 1 cm lateral and 2 cm distal to the ASIS and coursing towards  the anterior cortex of the femur. The skin was cut with a 10 blade  through subcutaneous tissue to the level of the fascia overlying the  tensor fascia lata muscle. The fascia was then incised in line with the  incision at the junction of the anterior third and posterior 2/3rd. The  muscle was teased off the fascia and then the interval between the TFL  and the rectus was developed. The Hohmann retractor was then placed at  the top of the femoral neck over the capsule. The vessels overlying the  capsule were cauterized and the fat on top of the capsule was removed.  A Hohmann retractor was then placed anterior underneath the rectus  femoris to give exposure to the entire anterior capsule. A T-shaped  capsulotomy was  performed. The edges were tagged and the femoral head  was identified.       Osteophytes are removed off the superior acetabulum.  The femoral neck was then cut in situ with an oscillating saw. Traction  was then applied to the left lower extremity utilizing the [facility]  traction. The femoral head was then removed. Retractors were placed  around the acetabulum and then circumferential removal of the labrum was  performed. Osteophytes were also removed. Reaming starts at 49 mm to  medialize and  Increased in 2 mm increments to 53 mm. We reamed in  approximately 40 degrees of abduction, 20 degrees anteversion. A 54 mm  pinnacle acetabular shell was then impacted in anatomic position under  fluoroscopic guidance with excellent purchase. We did not need to place  any additional dome screws. A 36 mm neutral + 4 marathon liner was then  placed into the acetabular shell.       The femoral lift was then placed along the lateral aspect of the femur  just distal to the vastus ridge. The leg was  externally rotated and capsule  was stripped off the inferior aspect of the femoral neck down to the  level of the lesser trochanter, this was done with electrocautery. The femur was lifted after this was performed. The  leg was then placed and extended in adducted position to essentially delivering the femur. We also removed the capsule superiorly and the  piriformis from the  piriformis fossa to gain excellent exposure of the  proximal femur. Rongeur was used to remove some cancellous bone to get  into the lateral portion of the proximal femur for placement of the  initial starter reamer. The starter broaches was placed  the starter broach  and was shown to go down the center of the canal. Broaching  with the  Corail system was then performed starting at size 8, coursing  Up to size 11. A size 11 had excellent torsional and rotational  and axial stability. The trial standard offset neck was then placed  with a  36 + 1.5 trial head. The hip was then reduced. We confirmed that  the stem was in the canal both on AP and lateral x-rays. It also has excellent sizing. The hip was reduced with outstanding stability through full extension, full external rotation,  and then flexion in adduction internal rotation. AP pelvis was taken  and the leg lengths were measured and found to be exactly equal. Hip  was then dislocated again and the femoral head and neck removed. The  femoral broach was removed. Size 11 Corail stem with a standard offset  neck was then impacted into the femur following native anteversion. Has  excellent purchase in the canal. Excellent torsional and rotational and  axial stability. It is confirmed to be in the canal on AP and lateral  fluoroscopic views. The 36 + 1.5 ceramic head was placed and the hip  reduced with outstanding stability. Again AP pelvis was taken and it  confirmed that the leg lengths were equal. The wound was then copiously  irrigated with saline solution and the capsule reattached and repaired  with Ethibond suture. 30 ml of .25% Bupivicaine injected into the capsule and into the edge of the tensor fascia lata as well as subcutaneous tissue. The fascia overlying the tensor fascia lata was  then closed with a running #1 V-Loc. Subcu was closed with interrupted  2-0 Vicryl and subcuticular running 4-0 Monocryl. Incision was cleaned  and dried. Steri-Strips and a bulky sterile dressing applied. Hemovac  drain was hooked to suction and then he was awakened and transported to  recovery in stable condition.        Please note that a surgical assistant was a medical necessity for this procedure to perform it in a safe and expeditious manner. Assistant was necessary to provide appropriate retraction of vital neurovascular structures and to prevent femoral fracture and allow for anatomic placement of the prosthesis.  Ollen Gross, M.D.

## 2016-05-04 LAB — CBC
HCT: 32.5 % — ABNORMAL LOW (ref 39.0–52.0)
HEMOGLOBIN: 11.4 g/dL — AB (ref 13.0–17.0)
MCH: 31.8 pg (ref 26.0–34.0)
MCHC: 35.1 g/dL (ref 30.0–36.0)
MCV: 90.5 fL (ref 78.0–100.0)
Platelets: 216 10*3/uL (ref 150–400)
RBC: 3.59 MIL/uL — AB (ref 4.22–5.81)
RDW: 12.6 % (ref 11.5–15.5)
WBC: 11.1 10*3/uL — ABNORMAL HIGH (ref 4.0–10.5)

## 2016-05-04 LAB — BASIC METABOLIC PANEL
Anion gap: 5 (ref 5–15)
BUN: 13 mg/dL (ref 6–20)
CHLORIDE: 105 mmol/L (ref 101–111)
CO2: 27 mmol/L (ref 22–32)
Calcium: 8.7 mg/dL — ABNORMAL LOW (ref 8.9–10.3)
Creatinine, Ser: 0.67 mg/dL (ref 0.61–1.24)
GFR calc Af Amer: >60 mL/min (ref >60)
GFR calc non Af Amer: >60 mL/min (ref >60)
Glucose, Bld: 187 mg/dL — ABNORMAL HIGH (ref 65–99)
POTASSIUM: 4.2 mmol/L (ref 3.5–5.1)
Sodium: 137 mmol/L (ref 135–145)

## 2016-05-04 MED ORDER — OXYCODONE HCL 5 MG PO TABS: 5.0000 mg | ORAL_TABLET | ORAL | Status: DC | PRN

## 2016-05-04 MED ORDER — RIVAROXABAN 10 MG PO TABS: 10.0000 mg | ORAL_TABLET | Freq: Every day | ORAL | Status: DC

## 2016-05-04 MED ORDER — METHOCARBAMOL 500 MG PO TABS: 500.0000 mg | ORAL_TABLET | Freq: Four times a day (QID) | ORAL | Status: DC | PRN

## 2016-05-04 MED ORDER — TRAMADOL HCL 50 MG PO TABS: 50.0000 mg | ORAL_TABLET | Freq: Four times a day (QID) | ORAL | Status: DC | PRN

## 2016-05-04 NOTE — Evaluation (Signed)
Occupational Therapy Evaluation Patient Details Name: Russell Gillespie MRN: [id number] DOB: [birth year] Today's Date: 05/04/2016    History of Present Illness s/p R DA THA   Clinical Impression   This 55 year old man was admitted for the above surgery. All education was completed. No further OT is needed at this time.    Follow Up Recommendations  Supervision/Assistance - 24 hour (initially)    Equipment Recommendations  None recommended by OT (has 3:1 commode)    Recommendations for Other Services       Precautions / Restrictions Precautions Precautions: Fall Restrictions Weight Bearing Restrictions: No      Mobility Bed Mobility Overal bed mobility: Needs Assistance Bed Mobility: Sit to Supine       Sit to supine: Min assist   General bed mobility comments: assist for LLE  Transfers Overall transfer level: Needs assistance Equipment used: Rolling walker (2 wheeled) Transfers: Sit to/from Stand Sit to Stand: Min guard         General transfer comment: for safety; cues for UE/LE placement    Balance                                            ADL Overall ADL's : Needs assistance/impaired             Lower Body Bathing: Minimal assistance;Sit to/from stand       Lower Body Dressing: Moderate assistance;Sit to/from stand   Toilet Transfer: Min guard;Ambulation;BSC;RW             General ADL Comments: educated on use of reacher (pt has one).  Also using long handled net on stick, if desired.  Pt did not practice shower transfer due to pain, but I demonstrated this and he verbalizes understanding. Wife will assist as needed.  Pt can perform UB adls with set up     Vision     Perception     Praxis      Pertinent Vitals/Pain Pain Assessment: 0-10 Pain Score: 6  Pain Location: R hip Pain Descriptors / Indicators: Aching;Burning Pain Intervention(s): Limited activity within patient's tolerance;Monitored during  session;Premedicated before session;Repositioned;Ice applied     Hand Dominance     Extremity/Trunk Assessment Upper Extremity Assessment Upper Extremity Assessment: Overall WFL for tasks assessed           Communication Communication Communication: No difficulties   Cognition Arousal/Alertness: Awake/alert Behavior During Therapy: WFL for tasks assessed/performed Overall Cognitive Status: Within Functional Limits for tasks assessed                     General Comments       Exercises       Shoulder Instructions      Home Living Family/patient expects to be discharged to:: Private residence Living Arrangements: Spouse/significant other Available Help at Discharge: Family               Bathroom Shower/Tub: Producer, television/film/video: Standard     Home Equipment: Bedside commode          Prior Functioning/Environment Level of Independence: Independent             OT Diagnosis: Acute pain   OT Problem List:     OT Treatment/Interventions:      OT Goals(Current goals can be found in the care plan section) Acute Rehab  OT Goals Patient Stated Goal: regain independence OT Goal Formulation: Patient unable to participate in goal setting  OT Frequency:     Barriers to D/C:            Co-evaluation              End of Session    Activity Tolerance: Patient tolerated treatment well Patient left: in bed;with call bell/phone within reach   Time: 0946-1006 OT Time Calculation (min): 20 min Charges:  OT General Charges $OT Visit: 1 Procedure OT Evaluation $OT Eval Low Complexity: 1 Procedure G-Codes:    Azoria Abbett 05/04/2016, 11:09 AM  Russell OtterMaryellen Lonza Gillespie, OTR/L [phone] 05/04/2016

## 2016-05-04 NOTE — Evaluation (Signed)
Physical Therapy Evaluation Patient Details Name: Russell Gillespie MRN: [id number] DOB: [birth year] Today's Date: 05/04/2016   History of Present Illness  s/p R DA THA  Clinical Impression  Pt s/p R THR presents with decreased R LE strength/ROM and post op pain limiting functional mobility.  Pt should progress to dc home with family assist and HHPT follow up.    Follow Up Recommendations Home health PT    Equipment Recommendations  None recommended by PT    Recommendations for Other Services OT consult     Precautions / Restrictions Precautions Precautions: Fall Restrictions Weight Bearing Restrictions: No Other Position/Activity Restrictions: WBAT      Mobility  Bed Mobility Overal bed mobility: Needs Assistance Bed Mobility: Sit to Supine     Supine to sit: Min assist Sit to supine: Min assist   General bed mobility comments: assist for LLE  Transfers Overall transfer level: Needs assistance Equipment used: Rolling walker (2 wheeled) Transfers: Sit to/from Stand Sit to Stand: Min guard         General transfer comment: for safety; cues for UE/LE placement  Ambulation/Gait Ambulation/Gait assistance: Min assist;Min guard Ambulation Distance (Feet): 150 Feet Assistive device: Rolling walker (2 wheeled) Gait Pattern/deviations: Step-to pattern;Step-through pattern;Decreased step length - right;Decreased step length - left;Shuffle;Trunk flexed Gait velocity: decr   General Gait Details: cues for posture, position from RW and initial sequence  Stairs            Wheelchair Mobility    Modified Rankin (Stroke Patients Only)       Balance                                             Pertinent Vitals/Pain Pain Assessment: 0-10 Pain Score: 4  Pain Location: R hip Pain Descriptors / Indicators: Aching;Sore Pain Intervention(s): Limited activity within patient's tolerance;Monitored during session;Premedicated before  session;Ice applied    Home Living Family/patient expects to be discharged to:: Private residence Living Arrangements: Spouse/significant other Available Help at Discharge: Family Type of Home: House Home Access: Stairs to enter Entrance Stairs-Rails: Doctor, general practiceight;Left Entrance Stairs-Number of Steps: 2+1  Home Layout: Two level Home Equipment: Environmental consultantWalker - 2 wheels      Prior Function Level of Independence: Independent               Hand Dominance        Extremity/Trunk Assessment   Upper Extremity Assessment: Overall WFL for tasks assessed           Lower Extremity Assessment: RLE deficits/detail RLE Deficits / Details: Strength at hip 2+/5 with AAROM at hip to 85 flex and 20 abd    Cervical / Trunk Assessment: Normal  Communication   Communication: No difficulties  Cognition Arousal/Alertness: Awake/alert Behavior During Therapy: WFL for tasks assessed/performed Overall Cognitive Status: Within Functional Limits for tasks assessed                      General Comments      Exercises Total Joint Exercises Ankle Circles/Pumps: AROM;Both;15 reps;Supine Quad Sets: AROM;Both;10 reps;Supine Heel Slides: AAROM;Right;20 reps;Supine Hip ABduction/ADduction: AAROM;Right;15 reps;Supine      Assessment/Plan    PT Assessment Patient needs continued PT services  PT Diagnosis Difficulty walking   PT Problem List Decreased strength;Decreased range of motion;Decreased activity tolerance;Decreased mobility;Decreased knowledge of use of DME;Pain  PT Treatment Interventions DME  instruction;Gait training;Stair training;Functional mobility training;Therapeutic activities;Therapeutic exercise;Patient/family education   PT Goals (Current goals can be found in the Care Plan section) Acute Rehab PT Goals Patient Stated Goal: regain independence PT Goal Formulation: With patient Time For Goal Achievement: 05/06/16 Potential to Achieve Goals: Good    Frequency 7X/week    Barriers to discharge        Co-evaluation               End of Session Equipment Utilized During Treatment: Gait belt Activity Tolerance: Patient tolerated treatment well Patient left: in chair;with call bell/phone within reach;with family/visitor present Nurse Communication: Mobility status         Time: 8119-1478 PT Time Calculation (min) (ACUTE ONLY): 38 min   Charges:   PT Evaluation $PT Eval Low Complexity: 1 Procedure PT Treatments $Gait Training: 8-22 mins $Therapeutic Exercise: 8-22 mins   PT G Codes:        Aashika Carta 05/16/16, 12:03 PM

## 2016-05-04 NOTE — Care Management Note (Signed)
Case Management Note  Patient Details  Name: Paulene FloorChristopher L Wich MRN: [id number] Date of Birth: June 03, 1961  Subjective/Objective:  D/c home w/HHPT-[facility] rep Tim aware of HHC, & d/c. No further CM needs.                  Action/Plan:d/c home w/HHC   Expected Discharge Date:                 Expected Discharge Plan:  Home w Home Health Services  In-House Referral:     Discharge planning Services  CM Consult  Post Acute Care Choice:    Choice offered to:  Patient  DME Arranged:    DME Agency:     HH Arranged:  [facility]  Status of Service:  Completed, signed off  Medicare Important Message Given:    Date Medicare IM Given:    Medicare IM give by:    Date Additional Medicare IM Given:    Additional Medicare Important Message give by:     If discussed at Long Length of Stay Meetings, dates discussed:    Additional Comments:  Lanier ClamMahabir, Tarryn Bogdan, RN 05/04/2016, 12:42 PM

## 2016-05-04 NOTE — Care Management Note (Signed)
Case Management Note  Patient Details  Name: Russell FloorChristopher L Gillespie MRN: [id number] Date of Birth: 09/12/1961  Subjective/Objective: 55 y/o m admitted w/OA  R hip. POD#1 R THA. From home. Russell [employer] rep Russell Gillespie already following. Await PT recc.                   Action/Plan:d/c home w/HHC.   Expected Discharge Date:                 Expected Discharge Plan:  Home w Home Health Services  In-House Referral:     Discharge planning Services  CM Consult  Post Acute Care Choice:    Choice offered to:     DME Arranged:    DME Agency:     HH Arranged:    HH Agency:     Status of Service:  In process, will continue to follow  Medicare Important Message Given:    Date Medicare IM Given:    Medicare IM give by:    Date Additional Medicare IM Given:    Additional Medicare Important Message give by:     If discussed at Long Length of Stay Meetings, dates discussed:    Additional Comments:  Russell Gillespie, Russell Gupton, RN 05/04/2016, 11:05 AM

## 2016-05-04 NOTE — Discharge Instructions (Addendum)
° °Dr. Frank Aluisio °Total Joint Specialist °[facility] °[address] °[place], [place] [address] °[phone] ° °ANTERIOR APPROACH TOTAL HIP REPLACEMENT POSTOPERATIVE DIRECTIONS ° ° °Hip Rehabilitation, Guidelines Following Surgery  °The results of a hip operation are greatly improved after range of motion and muscle strengthening exercises. Follow all safety measures which are given to protect your hip. If any of these exercises cause increased pain or swelling in your joint, decrease the amount until you are comfortable again. Then slowly increase the exercises. Call your caregiver if you have problems or questions.  ° °HOME CARE INSTRUCTIONS  °Remove items at home which could result in a fall. This includes throw rugs or furniture in walking pathways.  °· ICE to the affected hip every three hours for 30 minutes at a time and then as needed for pain and swelling.  Continue to use ice on the hip for pain and swelling from surgery. You may notice swelling that will progress down to the foot and ankle.  This is normal after surgery.  Elevate the leg when you are not up walking on it.   °· Continue to use the breathing machine which will help keep your temperature down.  It is common for your temperature to cycle up and down following surgery, especially at night when you are not up moving around and exerting yourself.  The breathing machine keeps your lungs expanded and your temperature down. ° ° °DIET °You may resume your previous home diet once your are discharged from the hospital. ° °DRESSING / WOUND CARE / SHOWERING °You may shower 3 days after surgery, but keep the wounds dry during showering.  You may use an occlusive plastic wrap (Press'n Seal for example), NO SOAKING/SUBMERGING IN THE BATHTUB.  If the bandage gets wet, change with a clean dry gauze.  If the incision gets wet, pat the wound dry with a clean towel. °You may start showering once you are discharged home but do not  submerge the incision under water. Just pat the incision dry and apply a dry gauze dressing on daily. °Change the surgical dressing daily and reapply a dry dressing each time. ° °ACTIVITY °Walk with your walker as instructed. °Use walker as long as suggested by your caregivers. °Avoid periods of inactivity such as sitting longer than an hour when not asleep. This helps prevent blood clots.  °You may resume a sexual relationship in one month or when given the OK by your doctor.  °You may return to work once you are cleared by your doctor.  °Do not drive a car for 6 weeks or until released by you surgeon.  °Do not drive while taking narcotics. ° °WEIGHT BEARING °Weight bearing as tolerated with assist device (walker, cane, etc) as directed, use it as long as suggested by your surgeon or therapist, typically at least 4-6 weeks. ° °POSTOPERATIVE CONSTIPATION PROTOCOL °Constipation - defined medically as fewer than three stools per week and severe constipation as less than one stool per week. ° °One of the most common issues patients have following surgery is constipation.  Even if you have a regular bowel pattern at home, your normal regimen is likely to be disrupted due to multiple reasons following surgery.  Combination of anesthesia, postoperative narcotics, change in appetite and fluid intake all can affect your bowels.  In order to avoid complications following surgery, here are some recommendations in order to help you during your recovery period. ° °Colace (docusate) - Pick up an over-the-counter   form of Colace or another stool softener and take twice a day as long as you are requiring postoperative pain medications.  Take with a full glass of water daily.  If you experience loose stools or diarrhea, hold the colace until you stool forms back up.  If your symptoms do not get better within 1 week or if they get worse, check with your doctor. ° °Dulcolax (bisacodyl) - Pick up over-the-counter and take as directed  by the product packaging as needed to assist with the movement of your bowels.  Take with a full glass of water.  Use this product as needed if not relieved by Colace only.  ° °MiraLax (polyethylene glycol) - Pick up over-the-counter to have on hand.  MiraLax is a solution that will increase the amount of water in your bowels to assist with bowel movements.  Take as directed and can mix with a glass of water, juice, soda, coffee, or tea.  Take if you go more than two days without a movement. °Do not use MiraLax more than once per day. Call your doctor if you are still constipated or irregular after using this medication for 7 days in a row. ° °If you continue to have problems with postoperative constipation, please contact the office for further assistance and recommendations.  If you experience "the worst abdominal pain ever" or develop nausea or vomiting, please contact the office immediatly for further recommendations for treatment. ° °ITCHING ° If you experience itching with your medications, try taking only a single pain pill, or even half a pain pill at a time.  You can also use Benadryl over the counter for itching or also to help with sleep.  ° °TED HOSE STOCKINGS °Wear the elastic stockings on both legs for three weeks following surgery during the day but you may remove then at night for sleeping. ° °MEDICATIONS °See your medication summary on the “After Visit Summary” that the nursing staff will review with you prior to discharge.  You may have some home medications which will be placed on hold until you complete the course of blood thinner medication.  It is important for you to complete the blood thinner medication as prescribed by your surgeon.  Continue your approved medications as instructed at time of discharge. ° °PRECAUTIONS °If you experience chest pain or shortness of breath - call 911 immediately for transfer to the hospital emergency department.  °If you develop a fever greater that 101 F,  purulent drainage from wound, increased redness or drainage from wound, foul odor from the wound/dressing, or calf pain - CONTACT YOUR SURGEON.   °                                                °FOLLOW-UP APPOINTMENTS °Make sure you keep all of your appointments after your operation with your surgeon and caregivers. You should call the office at the above phone number and make an appointment for approximately two weeks after the date of your surgery or on the date instructed by your surgeon outlined in the "After Visit Summary". ° °RANGE OF MOTION AND STRENGTHENING EXERCISES  °These exercises are designed to help you keep full movement of your hip joint. Follow your caregiver's or physical therapist's instructions. Perform all exercises about fifteen times, three times per day or as directed. Exercise both hips, even if you   have had only one joint replacement. These exercises can be done on a training (exercise) mat, on the floor, on a table or on a bed. Use whatever works the best and is most comfortable for you. Use music or television while you are exercising so that the exercises are a pleasant break in your day. This will make your life better with the exercises acting as a break in routine you can look forward to.  °Lying on your back, slowly slide your foot toward your buttocks, raising your knee up off the floor. Then slowly slide your foot back down until your leg is straight again.  °Lying on your back spread your legs as far apart as you can without causing discomfort.  °Lying on your side, raise your upper leg and foot straight up from the floor as far as is comfortable. Slowly lower the leg and repeat.  °Lying on your back, tighten up the muscle in the front of your thigh (quadriceps muscles). You can do this by keeping your leg straight and trying to raise your heel off the floor. This helps strengthen the largest muscle supporting your knee.  °Lying on your back, tighten up the muscles of your  buttocks both with the legs straight and with the knee bent at a comfortable angle while keeping your heel on the floor.  ° °IF YOU ARE TRANSFERRED TO A SKILLED REHAB FACILITY °If the patient is transferred to a skilled rehab facility following release from the hospital, a list of the current medications will be sent to the facility for the patient to continue.  When discharged from the skilled rehab facility, please have the facility set up the patient's Home Health Physical Therapy prior to being released. Also, the skilled facility will be responsible for providing the patient with their medications at time of release from the facility to include their pain medication, the muscle relaxants, and their blood thinner medication. If the patient is still at the rehab facility at time of the two week follow up appointment, the skilled rehab facility will also need to assist the patient in arranging follow up appointment in our office and any transportation needs. ° °MAKE SURE YOU:  °Understand these instructions.  °Get help right away if you are not doing well or get worse.  ° ° °Pick up stool softner and laxative for home use following surgery while on pain medications. °Do not submerge incision under water. °Please use good hand washing techniques while changing dressing each day. °May shower starting three days after surgery. °Please use a clean towel to pat the incision dry following showers. °Continue to use ice for pain and swelling after surgery. °Do not use any lotions or creams on the incision until instructed by your surgeon. ° °Take Xarelto for two and a half more weeks, then discontinue Xarelto. °Once the patient has completed the blood thinner regimen, then take a Baby 81 mg Aspirin daily for three more weeks. ° ° ° °Information on my medicine - XARELTO® (Rivaroxaban) ° °This medication education was reviewed with me or my healthcare representative as part of my discharge preparation.  The pharmacist that  spoke with me during my hospital stay was:  Murielle Stang Z, RPh °Why was Xarelto® prescribed for you? °Xarelto® was prescribed for you to reduce the risk of blood clots forming after orthopedic surgery. The medical term for these abnormal blood clots is venous thromboembolism (VTE). ° °What do you need to know about xarelto® ? °Take your Xarelto® ONCE   DAILY at the same time every day. °You may take it either with or without food. ° °If you have difficulty swallowing the tablet whole, you may crush it and mix in applesauce just prior to taking your dose. ° °Take Xarelto® exactly as prescribed by your doctor and DO NOT stop taking Xarelto® without talking to the doctor who prescribed the medication.  Stopping without other VTE prevention medication to take the place of Xarelto® may increase your risk of developing a clot. ° °After discharge, you should have regular check-up appointments with your healthcare provider that is prescribing your Xarelto®.   ° °What do you do if you miss a dose? °If you miss a dose, take it as soon as you remember on the same day then continue your regularly scheduled once daily regimen the next day. Do not take two doses of Xarelto® on the same day.  ° °Important Safety Information °A possible side effect of Xarelto® is bleeding. You should call your healthcare provider right away if you experience any of the following: °? Bleeding from an injury or your nose that does not stop. °? Unusual colored urine (red or dark brown) or unusual colored stools (red or black). °? Unusual bruising for unknown reasons. °? A serious fall or if you hit your head (even if there is no bleeding). ° °Some medicines may interact with Xarelto® and might increase your risk of bleeding while on Xarelto®. To help avoid this, consult your healthcare provider or pharmacist prior to using any new prescription or non-prescription medications, including herbals, vitamins, non-steroidal anti-inflammatory drugs (NSAIDs) and  supplements. ° °This website has more information on Xarelto®: www.xarelto.com. ° ° ° °

## 2016-05-04 NOTE — Progress Notes (Signed)
   Subjective: 1 Day Post-Op Procedure(s) (LRB): RIGHT TOTAL HIP ARTHROPLASTY ANTERIOR APPROACH (Right) Patient reports pain as mild.   Patient seen in rounds with Dr. Lequita HaltAluisio. Patient is well, and has had no acute complaints or problems We will start therapy today.  If they do well with therapy and meets all goals, then will allow home later this afternoon following therapy. Plan is to go Home after hospital stay.  Objective: Vital signs in last 24 hours: Temp:  [97.4 F (36.3 C)-98.2 F (36.8 C)] 97.8 F (36.6 C) (05/25 0641) Pulse Rate:  [49-91] 63 (05/25 0641) Resp:  [10-18] 18 (05/25 0641) BP: (105-124)/(51-82) 111/61 mmHg (05/25 0641) SpO2:  [95 %-100 %] 96 % (05/25 0641)  Intake/Output from previous day:  Intake/Output Summary (Last 24 hours) at 05/04/16 0834 Last data filed at 05/04/16 0626  Gross per 24 hour  Intake 5303.33 ml  Output   5635 ml  Net -331.67 ml    Intake/Output this shift: UOP 925  Labs:  Recent Labs  05/04/16 0358  HGB 11.4*    Recent Labs  05/04/16 0358  WBC 11.1*  RBC 3.59*  HCT 32.5*  PLT 216    Recent Labs  05/04/16 0358  NA 137  K 4.2  CL 105  CO2 27  BUN 13  CREATININE 0.67  GLUCOSE 187*  CALCIUM 8.7*   No results for input(s): LABPT, INR in the last 72 hours.  EXAM General - Patient is Alert, Appropriate and Oriented Extremity - Neurovascular intact Sensation intact distally Dorsiflexion/Plantar flexion intact Dressing - dressing C/D/I Motor Function - intact, moving foot and toes well on exam.  Hemovac pulled without difficulty.  Past Medical History  Diagnosis Date  . Hemorrhoids   . Tinnitus   . Irregular heart beat   . Internal thrombosed hemorrhoid-left lateral 05/23/2012  . Tuberculosis     tests positively on TB skin tests  . Chronic kidney disease     kidney stones  . Dysrhythmia     occasional irregular HR    Assessment/Plan: 1 Day Post-Op Procedure(s) (LRB): RIGHT TOTAL HIP ARTHROPLASTY  ANTERIOR APPROACH (Right) Principal Problem:   OA (osteoarthritis) of hip  Estimated body mass index is 31.19 kg/(m^2) as calculated from the following:   Height as of this encounter: 6' (1.829 m).   Weight as of this encounter: 104.327 kg (230 lb). Advance diet Up with therapy Discharge home with home health  DVT Prophylaxis - Xarelto Weight Bearing As Tolerated right Leg Hemovac Pulled Begin Therapy  If meets goals and able to go home: Discharge home with home health Diet - Cardiac diet Follow up - in 2 weeks Activity - WBAT Disposition - Home Condition Upon Discharge - home if improved. D/C Meds - See DC Summary DVT Prophylaxis - Xarelto  Avel Peacerew Joaopedro Eschbach, PA-C Orthopaedic Surgery 05/04/2016, 8:34 AM

## 2016-05-04 NOTE — Progress Notes (Signed)
Physical Therapy Treatment Patient Details Name: Russell Gillespie MRN: [id number] DOB: 21-Oct-1961 Today's Date: 05/04/2016    History of Present Illness s/p R DA THA    PT Comments    Pt progressing well with mobility and eager for dc home.  Reviewed stairs and car transfers with pt and spouse  Follow Up Recommendations  Home health PT     Equipment Recommendations  None recommended by PT    Recommendations for Other Services OT consult     Precautions / Restrictions Precautions Precautions: Fall Restrictions Weight Bearing Restrictions: No Other Position/Activity Restrictions: WBAT    Mobility  Bed Mobility Overal bed mobility: Needs Assistance Bed Mobility: Supine to Sit     Supine to sit: Min assist Sit to supine: Min assist   General bed mobility comments: assisted by wife  Transfers Overall transfer level: Needs assistance Equipment used: Rolling walker (2 wheeled) Transfers: Sit to/from Stand Sit to Stand: Supervision         General transfer comment: for safety; cues for UE/LE placement  Ambulation/Gait Ambulation/Gait assistance: Min guard;Supervision Ambulation Distance (Feet): 230 Feet Assistive device: Rolling walker (2 wheeled) Gait Pattern/deviations: Step-to pattern;Step-through pattern;Decreased step length - right;Decreased step length - left;Shuffle;Trunk flexed Gait velocity: decr   General Gait Details: cues for posture, position from RW and initial sequence   Stairs Stairs: Yes Stairs assistance: Min assist Stair Management: One rail Right;No rails;Step to pattern;Forwards;With walker;With crutches Number of Stairs: 9 General stair comments: single step fwd with RW and 8 steps fwd with rail and crutch;  Cues for sequence anf foot/RW/crutch placement.  Wife assisting   Wheelchair Mobility    Modified Rankin (Stroke Patients Only)       Balance                                    Cognition  Arousal/Alertness: Awake/alert Behavior During Therapy: WFL for tasks assessed/performed Overall Cognitive Status: Within Functional Limits for tasks assessed                      Exercises      General Comments        Pertinent Vitals/Pain Pain Assessment: 0-10 Pain Score: 5  Pain Location: R hip Pain Descriptors / Indicators: Aching;Sore Pain Intervention(s): Limited activity within patient's tolerance;Monitored during session;Premedicated before session;Ice applied    Home Living                      Prior Function            PT Goals (current goals can now be found in the care plan section) Acute Rehab PT Goals Patient Stated Goal: regain independence PT Goal Formulation: With patient Time For Goal Achievement: 05/06/16 Potential to Achieve Goals: Good Progress towards PT goals: Progressing toward goals    Frequency  7X/week    PT Plan Current plan remains appropriate    Co-evaluation             End of Session Equipment Utilized During Treatment: Gait belt Activity Tolerance: Patient tolerated treatment well Patient left: in chair;with call bell/phone within reach;with family/visitor present     Time: 1191-4782 PT Time Calculation (min) (ACUTE ONLY): 34 min  Charges:  $Gait Training: 8-22 mins $Therapeutic Activity: 8-22 mins  G Codes:      Aneudy Champlain 05/04/2016, 2:59 PM

## 2016-05-04 NOTE — Discharge Summary (Signed)
Physician Discharge Summary   Patient ID: Russell Gillespie MRN: [id number] DOB/AGE: September 13, 1961 55 y.o.  Admit date: 05/03/2016 Discharge date: 05/04/2016  Primary Diagnosis: Osteoarthritis of the Right hip.    Admission Diagnoses:  Past Medical History  Diagnosis Date  . Hemorrhoids   . Tinnitus   . Irregular heart beat   . Internal thrombosed hemorrhoid-left lateral 05/23/2012  . Tuberculosis     tests positively on TB skin tests  . Chronic kidney disease     kidney stones  . Dysrhythmia     occasional irregular HR   Discharge Diagnoses:   Principal Problem:   OA (osteoarthritis) of hip  Estimated body mass index is 31.19 kg/(m^2) as calculated from the following:   Height as of this encounter: 6' (1.829 m).   Weight as of this encounter: 104.327 kg (230 lb).  Procedure(s) (LRB): RIGHT TOTAL HIP ARTHROPLASTY ANTERIOR APPROACH (Right)   Consults: None  HPI: Russell Gillespie is a 55 y.o. male who has advanced end-  stage arthritis of his Right hip with progressively worsening pain and  dysfunction.The patient has failed nonoperative management and presents for  total hip arthroplasty.   Laboratory Data: Admission on 05/03/2016  Component Date Value Ref Range Status  . WBC 05/04/2016 11.1* 4.0 - 10.5 K/uL Final  . RBC 05/04/2016 3.59* 4.22 - 5.81 MIL/uL Final  . Hemoglobin 05/04/2016 11.4* 13.0 - 17.0 g/dL Final  . HCT 05/04/2016 32.5* 39.0 - 52.0 % Final  . MCV 05/04/2016 90.5  78.0 - 100.0 fL Final  . MCH 05/04/2016 31.8  26.0 - 34.0 pg Final  . MCHC 05/04/2016 35.1  30.0 - 36.0 g/dL Final  . RDW 05/04/2016 12.6  11.5 - 15.5 % Final  . Platelets 05/04/2016 216  150 - 400 K/uL Final  . Sodium 05/04/2016 137  135 - 145 mmol/L Final  . Potassium 05/04/2016 4.2  3.5 - 5.1 mmol/L Final  . Chloride 05/04/2016 105  101 - 111 mmol/L Final  . CO2 05/04/2016 27  22 - 32 mmol/L Final  . Glucose, Bld 05/04/2016 187* 65 - 99 mg/dL Final  . BUN 05/04/2016 13   6 - 20 mg/dL Final  . Creatinine, Ser 05/04/2016 0.67  0.61 - 1.24 mg/dL Final  . Calcium 05/04/2016 8.7* 8.9 - 10.3 mg/dL Final  . GFR calc non Af Amer 05/04/2016 >60  >60 mL/min Final  . GFR calc Af Amer 05/04/2016 >60  >60 mL/min Final   Comment: (NOTE) The eGFR has been calculated using the CKD EPI equation. This calculation has not been validated in all clinical situations. eGFR's persistently <60 mL/min signify possible Chronic Kidney Disease.   Georgiann Hahn gap 05/04/2016 5  5 - 15 Final  Hospital Outpatient Visit on 04/25/2016  Component Date Value Ref Range Status  . aPTT 04/25/2016 24  24 - 37 seconds Final  . WBC 04/25/2016 6.9  4.0 - 10.5 K/uL Final  . RBC 04/25/2016 4.61  4.22 - 5.81 MIL/uL Final  . Hemoglobin 04/25/2016 14.5  13.0 - 17.0 g/dL Final  . HCT 04/25/2016 40.8  39.0 - 52.0 % Final  . MCV 04/25/2016 88.5  78.0 - 100.0 fL Final  . MCH 04/25/2016 31.5  26.0 - 34.0 pg Final  . MCHC 04/25/2016 35.5  30.0 - 36.0 g/dL Final  . RDW 04/25/2016 12.6  11.5 - 15.5 % Final  . Platelets 04/25/2016 219  150 - 400 K/uL Final  . Sodium 04/25/2016 142  135 - 145  mmol/L Final  . Potassium 04/25/2016 4.1  3.5 - 5.1 mmol/L Final  . Chloride 04/25/2016 106  101 - 111 mmol/L Final  . CO2 04/25/2016 28  22 - 32 mmol/L Final  . Glucose, Bld 04/25/2016 102* 65 - 99 mg/dL Final  . BUN 04/25/2016 15  6 - 20 mg/dL Final  . Creatinine, Ser 04/25/2016 0.86  0.61 - 1.24 mg/dL Final  . Calcium 04/25/2016 9.6  8.9 - 10.3 mg/dL Final  . Total Protein 04/25/2016 6.9  6.5 - 8.1 g/dL Final  . Albumin 04/25/2016 4.5  3.5 - 5.0 g/dL Final  . AST 04/25/2016 25  15 - 41 U/L Final  . ALT 04/25/2016 36  17 - 63 U/L Final  . Alkaline Phosphatase 04/25/2016 57  38 - 126 U/L Final  . Total Bilirubin 04/25/2016 0.8  0.3 - 1.2 mg/dL Final  . GFR calc non Af Amer 04/25/2016 >60  >60 mL/min Final  . GFR calc Af Amer 04/25/2016 >60  >60 mL/min Final   Comment: (NOTE) The eGFR has been calculated using the  CKD EPI equation. This calculation has not been validated in all clinical situations. eGFR's persistently <60 mL/min signify possible Chronic Kidney Disease.   . Anion gap 04/25/2016 8  5 - 15 Final  . Prothrombin Time 04/25/2016 12.9  11.6 - 15.2 seconds Final  . INR 04/25/2016 0.95  0.00 - 1.49 Final  . ABO/RH(D) 04/25/2016 A POS   Final  . Antibody Screen 04/25/2016 NEG   Final  . Sample Expiration 04/25/2016 05/06/2016   Final  . Extend sample reason 04/25/2016 NO TRANSFUSIONS OR PREGNANCY IN THE PAST 3 MONTHS   Final  . Color, Urine 04/25/2016 YELLOW  YELLOW Final  . APPearance 04/25/2016 CLEAR  CLEAR Final  . Specific Gravity, Urine 04/25/2016 1.029  1.005 - 1.030 Final  . pH 04/25/2016 6.0  5.0 - 8.0 Final  . Glucose, UA 04/25/2016 NEGATIVE  NEGATIVE mg/dL Final  . Hgb urine dipstick 04/25/2016 NEGATIVE  NEGATIVE Final  . Bilirubin Urine 04/25/2016 NEGATIVE  NEGATIVE Final  . Ketones, ur 04/25/2016 NEGATIVE  NEGATIVE mg/dL Final  . Protein, ur 04/25/2016 NEGATIVE  NEGATIVE mg/dL Final  . Nitrite 04/25/2016 NEGATIVE  NEGATIVE Final  . Leukocytes, UA 04/25/2016 NEGATIVE  NEGATIVE Final   MICROSCOPIC NOT DONE ON URINES WITH NEGATIVE PROTEIN, BLOOD, LEUKOCYTES, NITRITE, OR GLUCOSE <1000 mg/dL.  Marland Kitchen MRSA, PCR 04/25/2016 NEGATIVE  NEGATIVE Final  . Staphylococcus aureus 04/25/2016 NEGATIVE  NEGATIVE Final   Comment:        The Xpert SA Assay (FDA approved for NASAL specimens in patients over 59 years of age), is one component of a comprehensive surveillance program.  Test performance has been validated by [facility] for patients greater than or equal to 61 year old. It is not intended to diagnose infection nor to guide or monitor treatment.      X-Rays:Dg Pelvis Portable  05/03/2016  CLINICAL DATA:  Status post right hip replacement EXAM: PORTABLE PELVIS 1-2 VIEWS COMPARISON:  None. FINDINGS: Total right hip replacement is identified without malalignment. Postsurgical  changes including drainage tube and soft tissue air are identified in the right hip. IMPRESSION: Total right hip replacement without malalignment. Electronically Signed   By: Abelardo Diesel M.D.   On: 05/03/2016 14:51   Dg C-arm 1-60 Min-no Report  05/03/2016  CLINICAL DATA: surgery C-ARM 1-60 MINUTES Fluoroscopy was utilized by the requesting physician.  No radiographic interpretation.    EKG: Orders placed or  performed during the hospital encounter of 04/25/16  . EKG 12-Lead  . EKG 12-Lead     Hospital Course: Patient was admitted to [facility] and taken to the OR and underwent the above state procedure without complications.  Patient tolerated the procedure well and was later transferred to the recovery room and then to the orthopaedic floor for postoperative care.  They were given PO and IV analgesics for pain control following their surgery.  They were given 24 hours of postoperative antibiotics of  Anti-infectives    Start     Dose/Rate Route Frequency Ordered Stop   05/03/16 1600  ceFAZolin (ANCEF) IVPB 2g/100 mL premix     2 g 200 mL/hr over 30 Minutes Intravenous Every 6 hours 05/03/16 1328 05/03/16 2156   05/03/16 0735  ceFAZolin (ANCEF) IVPB 2g/100 mL premix     2 g 200 mL/hr over 30 Minutes Intravenous On call to O.R. 05/03/16 5188 05/03/16 0933     and started on DVT prophylaxis in the form of Xarelto.   PT and OT were ordered for total hip protocol.  The patient was allowed to be WBAT with therapy. Discharge planning was consulted to help with postop disposition and equipment needs.  Patient had a good night on the evening of surgery.  They started to get up OOB with therapy on day one.  Hemovac drain was pulled without difficulty.  Patient was seen in rounds and was ready to go home later that day following therapy.  Discharge home with home health Diet - Cardiac diet Follow up - in 2 weeks Activity - WBAT Disposition - Home Condition Upon Discharge -  improving D/C Meds - See DC Summary DVT Prophylaxis - Xarelto  Discharge Instructions    Call MD / Call 911    Complete by:  As directed   If you experience chest pain or shortness of breath, CALL 911 and be transported to the hospital emergency room.  If you develope a fever above 101 F, pus (white drainage) or increased drainage or redness at the wound, or calf pain, call your surgeon's office.     Change dressing    Complete by:  As directed   You may change your dressing dressing daily with sterile 4 x 4 inch gauze dressing and paper tape.  Do not submerge the incision under water.     Constipation Prevention    Complete by:  As directed   Drink plenty of fluids.  Prune juice may be helpful.  You may use a stool softener, such as Colace (over the counter) 100 mg twice a day.  Use MiraLax (over the counter) for constipation as needed.     Diet - low sodium heart healthy    Complete by:  As directed      Discharge instructions    Complete by:  As directed   Pick up stool softner and laxative for home use following surgery while on pain medications. Do not submerge incision under water. May remove the surgical dressing tomorrow, Friday 05/05/2016, and then apply a dry gauze dressing daily. Please use good hand washing techniques while changing dressing each day. May shower starting three days after surgery starting Saturday 05/06/2016. Please use a clean towel to pat the incision dry following showers. Continue to use ice for pain and swelling after surgery. Do not use any lotions or creams on the incision until instructed by your surgeon.  Postoperative Constipation Protocol  Constipation - defined medically as fewer  than three stools per week and severe constipation as less than one stool per week.  One of the most common issues patients have following surgery is constipation. Even if you have a regular bowel pattern at home, your normal regimen is likely to be disrupted due to  multiple reasons following surgery. Combination of anesthesia, postoperative narcotics, change in appetite and fluid intake all can affect your bowels. In order to avoid complications following surgery, here are some recommendations in order to help you during your recovery period.  Colace (docusate) - Pick up an over-the-counter form of Colace or another stool softener and take twice a day as long as you are requiring postoperative pain medications. Take with a full glass of water daily. If you experience loose stools or diarrhea, hold the colace until you stool forms back up. If your symptoms do not get better within 1 week or if they get worse, check with your doctor.  Dulcolax (bisacodyl) - Pick up over-the-counter and take as directed by the product packaging as needed to assist with the movement of your bowels. Take with a full glass of water. Use this product as needed if not relieved by Colace only.   MiraLax (polyethylene glycol) - Pick up over-the-counter to have on hand. MiraLax is a solution that will increase the amount of water in your bowels to assist with bowel movements. Take as directed and can mix with a glass of water, juice, soda, coffee, or tea. Take if you go more than two days without a movement. Do not use MiraLax more than once per day. Call your doctor if you are still constipated or irregular after using this medication for 7 days in a row.  If you continue to have problems with postoperative constipation, please contact the office for further assistance and recommendations. If you experience "the worst abdominal pain ever" or develop nausea or vomiting, please contact the office immediatly for further recommendations for treatment.  Take Xarelto for two and a half more weeks, then discontinue Xarelto. Once the patient has completed the blood thinner regimen, then take a Baby 81 mg Aspirin daily for three more weeks.     Do not sit on low chairs, stoools or toilet  seats, as it may be difficult to get up from low surfaces    Complete by:  As directed      Driving restrictions    Complete by:  As directed   No driving until released by the physician.     Increase activity slowly as tolerated    Complete by:  As directed      Lifting restrictions    Complete by:  As directed   No lifting until released by the physician.     Patient may shower    Complete by:  As directed   You may shower without a dressing once there is no drainage.  Do not wash over the wound.  If drainage remains, do not shower until drainage stops.     TED hose    Complete by:  As directed   Use stockings (TED hose) for 3 weeks on both leg(s).  You may remove them at night for sleeping.     Weight bearing as tolerated    Complete by:  As directed   Laterality:  right  Extremity:  Lower            Medication List    STOP taking these medications        fish  oil-omega-3 fatty acids 1000 MG capsule      TAKE these medications        acetaminophen 500 MG tablet  Commonly known as:  TYLENOL  Take 1,000 mg by mouth every 6 (six) hours as needed (For pain.).     fluticasone 50 MCG/ACT nasal spray  Commonly known as:  FLONASE  Place 1 spray into both nostrils daily as needed for allergies.     methocarbamol 500 MG tablet  Commonly known as:  ROBAXIN  Take 1 tablet (500 mg total) by mouth every 6 (six) hours as needed for muscle spasms.     oxyCODONE 5 MG immediate release tablet  Commonly known as:  Oxy IR/ROXICODONE  Take 1-2 tablets (5-10 mg total) by mouth every 3 (three) hours as needed for moderate pain or severe pain.     rivaroxaban 10 MG Tabs tablet  Commonly known as:  XARELTO  Take 1 tablet (10 mg total) by mouth daily with breakfast. Take Xarelto for two and a half more weeks, then discontinue Xarelto. Once the patient has completed the blood thinner regimen, then take a Baby 81 mg Aspirin daily for three more weeks.     traMADol 50 MG tablet  Commonly  known as:  ULTRAM  Take 1-2 tablets (50-100 mg total) by mouth every 6 (six) hours as needed (mild pain).     VYVANSE 50 MG capsule  Generic drug:  lisdexamfetamine  Take 50 mg by mouth daily.           Follow-up Information    Follow up with Gearlean Alf, MD. Schedule an appointment as soon as possible for a visit on 05/16/2016.   Specialty:  Orthopedic Surgery   Why:  Call office at [phone] to setup appoitment on Tuesday 05/16/2016 with Dr. Wynelle Link.   Contact information:   [address] [place] [address] [phone]       Signed: Arlee Muslim, PA-C Orthopaedic Surgery 05/04/2016, 8:40 AM

## 2016-08-11 ENCOUNTER — Other Ambulatory Visit: Payer: Self-pay | Admitting: Neurosurgery

## 2016-08-11 DIAGNOSIS — M5416 Radiculopathy, lumbar region: Secondary | ICD-10-CM

## 2016-08-25 ENCOUNTER — Ambulatory Visit
Admission: RE | Admit: 2016-08-25 | Discharge: 2016-08-25 | Disposition: A | Discharge status: Home / Self Care | Payer: Managed Care, Other (non HMO) | Source: Ambulatory Visit | Attending: Neurosurgery | Admitting: Neurosurgery

## 2016-08-25 DIAGNOSIS — M5416 Radiculopathy, lumbar region: Secondary | ICD-10-CM

## 2016-08-25 MED ORDER — ONDANSETRON HCL 4 MG/2ML IJ SOLN
4.0000 mg | Freq: Once | INTRAMUSCULAR | Status: AC
  Administered 2016-08-25: 4 mg via INTRAMUSCULAR

## 2016-08-25 MED ORDER — IOPAMIDOL (ISOVUE-M 200) INJECTION 41%
15.0000 mL | Freq: Once | INTRAMUSCULAR | Status: AC
  Administered 2016-08-25: 15 mL via INTRATHECAL

## 2016-08-25 MED ORDER — MEPERIDINE HCL 100 MG/ML IJ SOLN
75.0000 mg | Freq: Once | INTRAMUSCULAR | Status: AC
  Administered 2016-08-25: 75 mg via INTRAMUSCULAR

## 2016-08-25 MED ORDER — DIAZEPAM 5 MG PO TABS
10.0000 mg | ORAL_TABLET | Freq: Once | ORAL | Status: AC
  Administered 2016-08-25: 10 mg via ORAL

## 2016-08-25 MED ORDER — ONDANSETRON HCL 4 MG/2ML IJ SOLN: 4.0000 mg | Freq: Four times a day (QID) | INTRAMUSCULAR | Status: DC | PRN

## 2016-08-25 NOTE — Progress Notes (Signed)
Patient states he has been off his Vyvanse for at least the past two days.  jkl

## 2016-08-25 NOTE — Discharge Instructions (Signed)
Myelogram Discharge Instructions  1. Go home and rest quietly for the next 24 hours.  It is important to lie flat for the next 24 hours.  Get up only to go to the restroom.  You may lie in the bed or on a couch on your back, your stomach, your left side or your right side.  You may have one pillow under your head.  You may have pillows between your knees while you are on your side or under your knees while you are on your back.  2. DO NOT drive today.  Recline the seat as far back as it will go, while still wearing your seat belt, on the way home.  3. You may get up to go to the bathroom as needed.  You may sit up for 10 minutes to eat.  You may resume your normal diet and medications unless otherwise indicated.  Drink lots of extra fluids today and tomorrow.  4. The incidence of headache, nausea, or vomiting is about 5% (one in 20 patients).  If you develop a headache, lie flat and drink plenty of fluids until the headache goes away.  Caffeinated beverages may be helpful.  If you develop severe nausea and vomiting or a headache that does not go away with flat bed rest, call [phone].  5. You may resume normal activities after your 24 hours of bed rest is over; however, do not exert yourself strongly or do any heavy lifting tomorrow. If when you get up you have a headache when standing, go back to bed and force fluids for another 24 hours.  6. Call your physician for a follow-up appointment.  The results of your myelogram will be sent directly to your physician by the following day.  7. If you have any questions or if complications develop after you arrive home, please call [phone].  Discharge instructions have been explained to the patient.  The patient, or the person responsible for the patient, fully understands these instructions.       MAY RESUME VYVANCE ON SEPT. 16, 2017, AFTER 9:30 AM.

## 2016-08-28 ENCOUNTER — Telehealth: Payer: Self-pay | Admitting: Radiology

## 2016-09-14 ENCOUNTER — Encounter (HOSPITAL_COMMUNITY): Payer: Self-pay | Admitting: *Deleted

## 2016-09-14 ENCOUNTER — Other Ambulatory Visit: Payer: Self-pay | Admitting: Neurosurgery

## 2016-09-14 NOTE — Progress Notes (Signed)
Pt denies SOB, chest pain, and being under the care of a cardiologist. Pt denies having a stress test, echo and cardiac cath. Pt denies having a chest x ray within the last year. Pt stated that labs were done last week; [facility] [address] [facility]. Pt stated that he was advised by MD to spot taking Aspirin 3 days prior to procedure; pt last diose of Aspirin was Monday. Pt also made aware to stop taking vitamins, fish oil and herbal medications. Do not take any NSAIDs ie: Ibuprofen, Advil, Naproxen, BC and Goody Powder or any medication containing Aspirin. Pt verbalized understanding of all pre-op instructions.

## 2016-09-15 ENCOUNTER — Observation Stay (HOSPITAL_COMMUNITY)
Admission: AD | Admit: 2016-09-15 | Discharge: 2016-09-18 | DRG: 516 | Disposition: A | Discharge status: Home / Self Care | Payer: Managed Care, Other (non HMO) | Source: Ambulatory Visit | Attending: Neurosurgery | Admitting: Neurosurgery

## 2016-09-15 ENCOUNTER — Ambulatory Visit (HOSPITAL_COMMUNITY): Payer: Managed Care, Other (non HMO) | Admitting: Anesthesiology

## 2016-09-15 ENCOUNTER — Encounter (HOSPITAL_COMMUNITY)
Admission: AD | Disposition: A | Discharge status: Home / Self Care | Payer: Self-pay | Source: Ambulatory Visit | Attending: Neurosurgery

## 2016-09-15 ENCOUNTER — Encounter (HOSPITAL_COMMUNITY): Payer: Self-pay | Admitting: Urology

## 2016-09-15 ENCOUNTER — Ambulatory Visit (HOSPITAL_COMMUNITY): Payer: Managed Care, Other (non HMO)

## 2016-09-15 DIAGNOSIS — Z96659 Presence of unspecified artificial knee joint: Secondary | ICD-10-CM | POA: Diagnosis present

## 2016-09-15 DIAGNOSIS — M5416 Radiculopathy, lumbar region: Secondary | ICD-10-CM | POA: Diagnosis present

## 2016-09-15 DIAGNOSIS — G9741 Accidental puncture or laceration of dura during a procedure: Secondary | ICD-10-CM | POA: Diagnosis not present

## 2016-09-15 DIAGNOSIS — M549 Dorsalgia, unspecified: Secondary | ICD-10-CM | POA: Diagnosis present

## 2016-09-15 DIAGNOSIS — M48061 Spinal stenosis, lumbar region without neurogenic claudication: Secondary | ICD-10-CM | POA: Diagnosis present

## 2016-09-15 HISTORY — PX: LUMBAR LAMINECTOMY/DECOMPRESSION MICRODISCECTOMY: SHX5026

## 2016-09-15 HISTORY — DX: Family history of other specified conditions: Z84.89

## 2016-09-15 HISTORY — DX: Radiculopathy, lumbar region: M54.16

## 2016-09-15 LAB — CBC
HEMATOCRIT: 45.1 % (ref 39.0–52.0)
HEMOGLOBIN: 15.2 g/dL (ref 13.0–17.0)
MCH: 29.8 pg (ref 26.0–34.0)
MCHC: 33.7 g/dL (ref 30.0–36.0)
MCV: 88.4 fL (ref 78.0–100.0)
Platelets: 196 10*3/uL (ref 150–400)
RBC: 5.1 MIL/uL (ref 4.22–5.81)
RDW: 13.1 % (ref 11.5–15.5)
WBC: 4.3 10*3/uL (ref 4.0–10.5)

## 2016-09-15 LAB — BASIC METABOLIC PANEL
ANION GAP: 5 (ref 5–15)
BUN: 13 mg/dL (ref 6–20)
CALCIUM: 9.5 mg/dL (ref 8.9–10.3)
CHLORIDE: 106 mmol/L (ref 101–111)
CO2: 29 mmol/L (ref 22–32)
Creatinine, Ser: 0.79 mg/dL (ref 0.61–1.24)
GFR calc non Af Amer: >60 mL/min (ref >60)
GLUCOSE: 121 mg/dL — AB (ref 65–99)
POTASSIUM: 4.1 mmol/L (ref 3.5–5.1)
Sodium: 140 mmol/L (ref 135–145)

## 2016-09-15 LAB — SURGICAL PCR SCREEN
MRSA, PCR: NEGATIVE
Staphylococcus aureus: NEGATIVE

## 2016-09-15 SURGERY — LUMBAR LAMINECTOMY/DECOMPRESSION MICRODISCECTOMY 2 LEVELS
Anesthesia: General | Site: Spine Lumbar | Laterality: Bilateral

## 2016-09-15 MED ORDER — ONDANSETRON HCL 4 MG/2ML IJ SOLN: 4.0000 mg | Freq: Four times a day (QID) | INTRAMUSCULAR | Status: DC | PRN

## 2016-09-15 MED ORDER — CEFAZOLIN SODIUM-DEXTROSE 2-4 GM/100ML-% IV SOLN
2.0000 g | INTRAVENOUS | Status: AC
  Administered 2016-09-15: 2 g via INTRAVENOUS
  Filled 2016-09-15: qty 100

## 2016-09-15 MED ORDER — FENTANYL CITRATE (PF) 100 MCG/2ML IJ SOLN: 25.0000 ug | INTRAMUSCULAR | Status: DC | PRN

## 2016-09-15 MED ORDER — ONDANSETRON HCL 4 MG/2ML IJ SOLN
INTRAMUSCULAR | Status: DC | PRN
  Administered 2016-09-15: 4 mg via INTRAVENOUS

## 2016-09-15 MED ORDER — SODIUM CHLORIDE 0.9 % IJ SOLN
INTRAMUSCULAR | Status: AC
  Filled 2016-09-15: qty 10

## 2016-09-15 MED ORDER — ACETAMINOPHEN 650 MG RE SUPP: 650.0000 mg | RECTAL | Status: DC | PRN

## 2016-09-15 MED ORDER — DIAZEPAM 5 MG PO TABS
10.0000 mg | ORAL_TABLET | Freq: Four times a day (QID) | ORAL | Status: DC | PRN
  Administered 2016-09-15 – 2016-09-17 (×4): 10 mg via ORAL
  Filled 2016-09-15 (×3): qty 2

## 2016-09-15 MED ORDER — ACETAMINOPHEN 325 MG PO TABS
650.0000 mg | ORAL_TABLET | ORAL | Status: DC | PRN
  Administered 2016-09-16 (×3): 650 mg via ORAL
  Filled 2016-09-15 (×3): qty 2

## 2016-09-15 MED ORDER — HEMOSTATIC AGENTS (NO CHARGE) OPTIME
TOPICAL | Status: DC | PRN
  Administered 2016-09-15: 1 via TOPICAL

## 2016-09-15 MED ORDER — LISDEXAMFETAMINE DIMESYLATE 30 MG PO CAPS
60.0000 mg | ORAL_CAPSULE | Freq: Every morning | ORAL | Status: DC
  Administered 2016-09-16 – 2016-09-18 (×3): 60 mg via ORAL
  Filled 2016-09-15 (×3): qty 2

## 2016-09-15 MED ORDER — DEXAMETHASONE SODIUM PHOSPHATE 10 MG/ML IJ SOLN
INTRAMUSCULAR | Status: AC
  Filled 2016-09-15: qty 1

## 2016-09-15 MED ORDER — LACTATED RINGERS IV SOLN
INTRAVENOUS | Status: DC
  Administered 2016-09-15 (×2): via INTRAVENOUS

## 2016-09-15 MED ORDER — SODIUM CHLORIDE 0.9 % IV SOLN: INTRAVENOUS | Status: DC

## 2016-09-15 MED ORDER — SUGAMMADEX SODIUM 200 MG/2ML IV SOLN
INTRAVENOUS | Status: DC | PRN
  Administered 2016-09-15: 200 mg via INTRAVENOUS

## 2016-09-15 MED ORDER — MIDAZOLAM HCL 5 MG/5ML IJ SOLN
INTRAMUSCULAR | Status: DC | PRN
  Administered 2016-09-15: 2 mg via INTRAVENOUS

## 2016-09-15 MED ORDER — CHLORHEXIDINE GLUCONATE CLOTH 2 % EX PADS: 6.0000 | MEDICATED_PAD | Freq: Once | CUTANEOUS | Status: DC

## 2016-09-15 MED ORDER — 0.9 % SODIUM CHLORIDE (POUR BTL) OPTIME
TOPICAL | Status: DC | PRN
  Administered 2016-09-15: 1000 mL

## 2016-09-15 MED ORDER — MUPIROCIN 2 % EX OINT
1.0000 "application " | TOPICAL_OINTMENT | Freq: Once | CUTANEOUS | Status: AC
  Administered 2016-09-15: 1 via TOPICAL
  Filled 2016-09-15: qty 22

## 2016-09-15 MED ORDER — MIDAZOLAM HCL 2 MG/2ML IJ SOLN
INTRAMUSCULAR | Status: AC
  Filled 2016-09-15: qty 2

## 2016-09-15 MED ORDER — MENTHOL 3 MG MT LOZG: 1.0000 | LOZENGE | OROMUCOSAL | Status: DC | PRN

## 2016-09-15 MED ORDER — ROCURONIUM BROMIDE 100 MG/10ML IV SOLN
INTRAVENOUS | Status: DC | PRN
  Administered 2016-09-15: 50 mg via INTRAVENOUS
  Administered 2016-09-15: 20 mg via INTRAVENOUS

## 2016-09-15 MED ORDER — PHENOL 1.4 % MT LIQD: 1.0000 | OROMUCOSAL | Status: DC | PRN

## 2016-09-15 MED ORDER — SODIUM CHLORIDE 0.9% FLUSH: 9.0000 mL | INTRAVENOUS | Status: DC | PRN

## 2016-09-15 MED ORDER — THROMBIN 5000 UNITS EX SOLR
CUTANEOUS | Status: AC
  Filled 2016-09-15: qty 5000

## 2016-09-15 MED ORDER — BUPIVACAINE-EPINEPHRINE (PF) 0.5% -1:200000 IJ SOLN
INTRAMUSCULAR | Status: AC
  Filled 2016-09-15: qty 30

## 2016-09-15 MED ORDER — SODIUM CHLORIDE 0.9 % IV SOLN: 250.0000 mL | INTRAVENOUS | Status: DC

## 2016-09-15 MED ORDER — DIPHENHYDRAMINE HCL 50 MG/ML IJ SOLN
INTRAMUSCULAR | Status: AC
  Filled 2016-09-15: qty 1

## 2016-09-15 MED ORDER — PROPOFOL 10 MG/ML IV BOLUS
INTRAVENOUS | Status: AC
  Filled 2016-09-15: qty 20

## 2016-09-15 MED ORDER — DEXAMETHASONE SODIUM PHOSPHATE 10 MG/ML IJ SOLN
INTRAMUSCULAR | Status: DC | PRN
  Administered 2016-09-15: 10 mg via INTRAVENOUS

## 2016-09-15 MED ORDER — LIDOCAINE 2% (20 MG/ML) 5 ML SYRINGE
INTRAMUSCULAR | Status: AC
  Filled 2016-09-15: qty 5

## 2016-09-15 MED ORDER — SODIUM CHLORIDE 0.9% FLUSH
3.0000 mL | Freq: Two times a day (BID) | INTRAVENOUS | Status: DC
  Administered 2016-09-16 – 2016-09-18 (×4): 3 mL via INTRAVENOUS

## 2016-09-15 MED ORDER — PROPOFOL 10 MG/ML IV BOLUS
INTRAVENOUS | Status: DC | PRN
  Administered 2016-09-15: 200 mg via INTRAVENOUS

## 2016-09-15 MED ORDER — ZOLPIDEM TARTRATE 5 MG PO TABS
5.0000 mg | ORAL_TABLET | Freq: Every evening | ORAL | Status: DC | PRN
  Filled 2016-09-15: qty 1

## 2016-09-15 MED ORDER — ASPIRIN EC 81 MG PO TBEC
81.0000 mg | DELAYED_RELEASE_TABLET | Freq: Every day | ORAL | Status: DC
  Administered 2016-09-15 – 2016-09-18 (×4): 81 mg via ORAL
  Filled 2016-09-15 (×4): qty 1

## 2016-09-15 MED ORDER — DIPHENHYDRAMINE HCL 50 MG/ML IJ SOLN: 12.5000 mg | Freq: Four times a day (QID) | INTRAMUSCULAR | Status: DC | PRN

## 2016-09-15 MED ORDER — FENTANYL CITRATE (PF) 100 MCG/2ML IJ SOLN
INTRAMUSCULAR | Status: DC | PRN
  Administered 2016-09-15: 50 ug via INTRAVENOUS
  Administered 2016-09-15: 100 ug via INTRAVENOUS
  Administered 2016-09-15: 50 ug via INTRAVENOUS

## 2016-09-15 MED ORDER — OXYCODONE-ACETAMINOPHEN 5-325 MG PO TABS: 1.0000 | ORAL_TABLET | ORAL | Status: DC | PRN

## 2016-09-15 MED ORDER — EPHEDRINE 5 MG/ML INJ
INTRAVENOUS | Status: AC
  Filled 2016-09-15: qty 10

## 2016-09-15 MED ORDER — SUGAMMADEX SODIUM 200 MG/2ML IV SOLN
INTRAVENOUS | Status: AC
  Filled 2016-09-15: qty 2

## 2016-09-15 MED ORDER — SODIUM CHLORIDE 0.9% FLUSH: 3.0000 mL | INTRAVENOUS | Status: DC | PRN

## 2016-09-15 MED ORDER — DIAZEPAM 5 MG PO TABS
ORAL_TABLET | ORAL | Status: AC
  Filled 2016-09-15: qty 2

## 2016-09-15 MED ORDER — ROCURONIUM BROMIDE 10 MG/ML (PF) SYRINGE
PREFILLED_SYRINGE | INTRAVENOUS | Status: AC
  Filled 2016-09-15: qty 10

## 2016-09-15 MED ORDER — FENTANYL CITRATE (PF) 100 MCG/2ML IJ SOLN
INTRAMUSCULAR | Status: AC
  Filled 2016-09-15: qty 4

## 2016-09-15 MED ORDER — LACTATED RINGERS IV SOLN: INTRAVENOUS | Status: DC

## 2016-09-15 MED ORDER — MORPHINE SULFATE 2 MG/ML IV SOLN
INTRAVENOUS | Status: DC
  Administered 2016-09-15: 10.5 mg via INTRAVENOUS
  Administered 2016-09-15: 17:00:00 via INTRAVENOUS
  Administered 2016-09-15: 10.5 mg via INTRAVENOUS
  Administered 2016-09-16: 12 mg via INTRAVENOUS
  Administered 2016-09-16: 1.5 mg via INTRAVENOUS

## 2016-09-15 MED ORDER — LIDOCAINE HCL (CARDIAC) 20 MG/ML IV SOLN
INTRAVENOUS | Status: DC | PRN
  Administered 2016-09-15: 100 mg via INTRAVENOUS

## 2016-09-15 MED ORDER — ONDANSETRON HCL 4 MG/2ML IJ SOLN
INTRAMUSCULAR | Status: AC
  Filled 2016-09-15: qty 2

## 2016-09-15 MED ORDER — SUFENTANIL CITRATE 50 MCG/ML IV SOLN
INTRAVENOUS | Status: AC
  Filled 2016-09-15: qty 1

## 2016-09-15 MED ORDER — DIPHENHYDRAMINE HCL 50 MG/ML IJ SOLN
INTRAMUSCULAR | Status: DC | PRN
  Administered 2016-09-15: 25 mg via INTRAVENOUS

## 2016-09-15 MED ORDER — ONDANSETRON HCL 4 MG/2ML IJ SOLN: 4.0000 mg | INTRAMUSCULAR | Status: DC | PRN

## 2016-09-15 MED ORDER — THROMBIN 5000 UNITS EX SOLR
CUTANEOUS | Status: AC
  Filled 2016-09-15: qty 10000

## 2016-09-15 MED ORDER — NALOXONE HCL 0.4 MG/ML IJ SOLN: 0.4000 mg | INTRAMUSCULAR | Status: DC | PRN

## 2016-09-15 MED ORDER — MORPHINE SULFATE 2 MG/ML IV SOLN
INTRAVENOUS | Status: AC
  Filled 2016-09-15: qty 25

## 2016-09-15 MED ORDER — DIPHENHYDRAMINE HCL 12.5 MG/5ML PO ELIX
12.5000 mg | ORAL_SOLUTION | Freq: Four times a day (QID) | ORAL | Status: DC | PRN
Start: 2016-09-15 — End: 2016-09-16

## 2016-09-15 MED ORDER — METOCLOPRAMIDE HCL 5 MG/ML IJ SOLN: 10.0000 mg | Freq: Once | INTRAMUSCULAR | Status: DC | PRN

## 2016-09-15 MED ORDER — EPHEDRINE SULFATE 50 MG/ML IJ SOLN
INTRAMUSCULAR | Status: DC | PRN
  Administered 2016-09-15 (×2): 10 mg via INTRAVENOUS

## 2016-09-15 MED ORDER — MEPERIDINE HCL 25 MG/ML IJ SOLN: 6.2500 mg | INTRAMUSCULAR | Status: DC | PRN

## 2016-09-15 MED ORDER — CEFAZOLIN IN D5W 1 GM/50ML IV SOLN
1.0000 g | Freq: Three times a day (TID) | INTRAVENOUS | Status: AC
  Administered 2016-09-15 – 2016-09-16 (×2): 1 g via INTRAVENOUS
  Filled 2016-09-15 (×3): qty 50

## 2016-09-15 MED ORDER — THROMBIN 5000 UNITS EX SOLR
CUTANEOUS | Status: DC | PRN
  Administered 2016-09-15: 10000 [IU] via TOPICAL

## 2016-09-15 MED ORDER — PHENYLEPHRINE HCL 10 MG/ML IJ SOLN
INTRAVENOUS | Status: DC | PRN
  Administered 2016-09-15: 25 ug/min via INTRAVENOUS

## 2016-09-15 SURGICAL SUPPLY — 61 items
ADH SKN CLS APL DERMABOND .7 (GAUZE/BANDAGES/DRESSINGS) ×1
BENZOIN TINCTURE PRP APPL 2/3 (GAUZE/BANDAGES/DRESSINGS) ×3 IMPLANT
BLADE CLIPPER SURG (BLADE) ×3 IMPLANT
BUR ACORN 6.0 (BURR) IMPLANT
BUR ACORN 6.0MM (BURR)
BUR ACRON 5.0MM COATED (BURR) ×3 IMPLANT
BUR MATCHSTICK NEURO 3.0 LAGG (BURR) ×3 IMPLANT
BUR ROUND FLUTED 5 RND (BURR) ×2 IMPLANT
BUR ROUND FLUTED 5MM RND (BURR) ×1
CANISTER SUCT 3000ML PPV (MISCELLANEOUS) ×3 IMPLANT
CLOSURE WOUND 1/2 X4 (GAUZE/BANDAGES/DRESSINGS) ×1
CONT SPEC 4OZ CLIKSEAL STRL BL (MISCELLANEOUS) ×3 IMPLANT
DERMABOND ADVANCED (GAUZE/BANDAGES/DRESSINGS) ×2
DERMABOND ADVANCED .7 DNX12 (GAUZE/BANDAGES/DRESSINGS) ×1 IMPLANT
DRAPE LAPAROTOMY 100X72X124 (DRAPES) ×3 IMPLANT
DRAPE MICROSCOPE LEICA (MISCELLANEOUS) ×3 IMPLANT
DRAPE POUCH INSTRU U-SHP 10X18 (DRAPES) ×3 IMPLANT
DRSG OPSITE POSTOP 4X6 (GAUZE/BANDAGES/DRESSINGS) ×3 IMPLANT
DRSG PAD ABDOMINAL 8X10 ST (GAUZE/BANDAGES/DRESSINGS) IMPLANT
DURAPREP 26ML APPLICATOR (WOUND CARE) ×3 IMPLANT
ELECT BLADE 4.0 EZ CLEAN MEGAD (MISCELLANEOUS) ×3
ELECT REM PT RETURN 9FT ADLT (ELECTROSURGICAL) ×3
ELECTRODE BLDE 4.0 EZ CLN MEGD (MISCELLANEOUS) ×1 IMPLANT
ELECTRODE REM PT RTRN 9FT ADLT (ELECTROSURGICAL) ×1 IMPLANT
GAUZE SPONGE 4X4 12PLY STRL (GAUZE/BANDAGES/DRESSINGS) ×3 IMPLANT
GAUZE SPONGE 4X4 16PLY XRAY LF (GAUZE/BANDAGES/DRESSINGS) IMPLANT
GLOVE BIOGEL M 8.0 STRL (GLOVE) ×3 IMPLANT
GLOVE EXAM NITRILE LRG STRL (GLOVE) IMPLANT
GLOVE EXAM NITRILE XL STR (GLOVE) IMPLANT
GLOVE EXAM NITRILE XS STR PU (GLOVE) IMPLANT
GOWN STRL REUS W/ TWL LRG LVL3 (GOWN DISPOSABLE) ×2 IMPLANT
GOWN STRL REUS W/ TWL XL LVL3 (GOWN DISPOSABLE) IMPLANT
GOWN STRL REUS W/TWL 2XL LVL3 (GOWN DISPOSABLE) IMPLANT
GOWN STRL REUS W/TWL LRG LVL3 (GOWN DISPOSABLE) ×4
GOWN STRL REUS W/TWL XL LVL3 (GOWN DISPOSABLE)
KIT BASIN OR (CUSTOM PROCEDURE TRAY) ×3 IMPLANT
KIT ROOM TURNOVER OR (KITS) ×3 IMPLANT
NEEDLE HYPO 18GX1.5 BLUNT FILL (NEEDLE) IMPLANT
NEEDLE HYPO 21X1.5 SAFETY (NEEDLE) IMPLANT
NEEDLE HYPO 25X1 1.5 SAFETY (NEEDLE) ×3 IMPLANT
NEEDLE SPNL 20GX3.5 QUINCKE YW (NEEDLE) IMPLANT
NS IRRIG 1000ML POUR BTL (IV SOLUTION) ×3 IMPLANT
PACK LAMINECTOMY NEURO (CUSTOM PROCEDURE TRAY) ×3 IMPLANT
PAD ARMBOARD 7.5X6 YLW CONV (MISCELLANEOUS) ×9 IMPLANT
PATTIES SURGICAL .5 X1 (DISPOSABLE) ×3 IMPLANT
PATTIES SURGICAL 1X1 (DISPOSABLE) ×6 IMPLANT
RUBBERBAND STERILE (MISCELLANEOUS) ×6 IMPLANT
SEALANT ADHERUS EXTEND TIP (MISCELLANEOUS) ×3 IMPLANT
SPONGE LAP 4X18 X RAY DECT (DISPOSABLE) IMPLANT
SPONGE SURGIFOAM ABS GEL SZ50 (HEMOSTASIS) ×3 IMPLANT
STAPLER VISISTAT 35W (STAPLE) ×3 IMPLANT
STRIP CLOSURE SKIN 1/2X4 (GAUZE/BANDAGES/DRESSINGS) ×2 IMPLANT
SUT PROLENE 6 0 BV (SUTURE) ×9 IMPLANT
SUT VIC AB 0 CT1 18XCR BRD8 (SUTURE) ×3 IMPLANT
SUT VIC AB 0 CT1 8-18 (SUTURE) ×6
SUT VIC AB 2-0 CP2 18 (SUTURE) IMPLANT
SUT VIC AB 3-0 SH 8-18 (SUTURE) ×3 IMPLANT
SYR 5ML LL (SYRINGE) IMPLANT
TOWEL OR 17X24 6PK STRL BLUE (TOWEL DISPOSABLE) ×3 IMPLANT
TOWEL OR 17X26 10 PK STRL BLUE (TOWEL DISPOSABLE) ×3 IMPLANT
WATER STERILE IRR 1000ML POUR (IV SOLUTION) ×3 IMPLANT

## 2016-09-15 NOTE — Anesthesia Postprocedure Evaluation (Signed)
Anesthesia Post Note  Patient: Russell Gillespie  Procedure(s) Performed: Procedure(s) (LRB): Bilateral Lumbar four - Lumber Five, Lumbar Five - Sacral One Laminectomy/Foraminotomy (Bilateral)  Patient location during evaluation: PACU Anesthesia Type: General Level of consciousness: awake and alert Pain management: pain level controlled Vital Signs Assessment: post-procedure vital signs reviewed and stable Respiratory status: spontaneous breathing, nonlabored ventilation, respiratory function stable and patient connected to nasal cannula oxygen Cardiovascular status: blood pressure returned to baseline and stable Postop Assessment: no signs of nausea or vomiting Anesthetic complications: no    Last Vitals:  Vitals:   09/15/16 1053 09/15/16 1710  BP: 134/84 139/78  Pulse: (!) 55 68  Resp: 20 20  Temp: 36.5 C 36.4 C    Last Pain:  Vitals:   09/15/16 1108  TempSrc:   PainSc: 3     LLE Motor Response: Purposeful movement;Responds to commands (09/15/16 1710) LLE Sensation: Full sensation (09/15/16 1710) RLE Motor Response: Purposeful movement;Responds to commands (09/15/16 1710) RLE Sensation: Full sensation (09/15/16 1710)      Trinty Marken S

## 2016-09-15 NOTE — H&P (Signed)
NAME:  Russell Gillespie, Russell Gillespie        ACCOUNT NO.:  [id number]  MEDICAL RECORD NO.:  [id number]  LOCATION:  PERIO                        FACILITY:  [facility]  PHYSICIAN:  Hilda LiasErnesto Denorris Reust, M.D.   DATE OF BIRTH:  01/05/1961  DATE OF ADMISSION:  09/15/2016 DATE OF DISCHARGE:                             HISTORY & PHYSICAL   HISTORY OF PRESENT ILLNESS:  Mr. Russell Gillespie is a gentleman, who in the past underwent surgery at the level of L2-L3.  He did well, but lately, he had been seen in my office complaining of back pain with radiation to both legs, associated with weakness.  He has failed with conservative treatment.  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  Because of worsening of the pain and no improvement, he is being admitted for surgery.  PAST MEDICAL HISTORY:  He has L2-L3 diskectomy, knee replacement, shoulder surgery.  ALLERGIES:  He is not allergic to any medication.  FAMILY HISTORY:  Positive for gout and high cholesterol.  SOCIAL HISTORY:  He does not smoke.  He drinks socially.  REVIEW OF SYSTEMS:  Positive for low back pain, tingling sensation in both legs.  PHYSICAL EXAMINATION:  HEAD, EAR, NOSE AND THROAT:  Normal. NECK:  Normal. CARDIOVASCULAR:  Normal. LUNGS:  Clear. ABDOMEN:  Normal. EXTREMITIES:  Scar in the shoulder.  He has a lumbar scar from previous surgery. NEUROLOGIC:  He has weakness of dorsiflexion on the right side with straight leg raising bilaterally about 60 degrees.  Reflexes 1+. Sensation normal, although he complained of some tingling sensation in both feet.  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  RECOMMENDATION:  The patient will be admitted for decompression at the level of 4-5 and 5-1.  He is fully aware of the risks such as infection, CSF  leak, worsening of the pain, need for further surgery, no improvement whatsoever.          ______________________________ Hilda LiasErnesto Ruhan Borak, M.D.     EB/MEDQ  D:  09/14/2016  T:  09/15/2016  Job:  [id number]

## 2016-09-15 NOTE — Transfer of Care (Signed)
Immediate Anesthesia Transfer of Care Note  Patient: Russell Gillespie  Procedure(s) Performed: Procedure(s) with comments: Bilateral Lumbar four - Lumber Five, Lumbar Five - Sacral One Laminectomy/Foraminotomy (Bilateral) - Bilateral Lumbar four - Lumber Five, Lumbar Five - Sacral One Laminectomy/Foraminotomy  Patient Location: PACU  Anesthesia Type:General  Level of Consciousness: awake  Airway & Oxygen Therapy: Patient Spontanous Breathing  Post-op Assessment: Report given to RN and Post -op Vital signs reviewed and stable  Post vital signs: Reviewed and stable  Last Vitals:  Vitals:   09/15/16 1053  BP: 134/84  Pulse: (!) 55  Resp: 20  Temp: 36.5 C    Last Pain:  Vitals:   09/15/16 1108  TempSrc:   PainSc: 3          Complications: No apparent anesthesia complications

## 2016-09-15 NOTE — Anesthesia Preprocedure Evaluation (Signed)
Anesthesia Evaluation  Patient identified by MRN, date of birth, ID band Patient awake    Reviewed: Allergy & Precautions, NPO status , Patient's Chart, lab work & pertinent test results  Airway Mallampati: II  TM Distance: >3 FB Neck ROM: Full    Dental no notable dental hx.    Pulmonary neg pulmonary ROS, former smoker,    Pulmonary exam normal breath sounds clear to auscultation       Cardiovascular negative cardio ROS Normal cardiovascular exam(-) dysrhythmias  Rhythm:Regular Rate:Normal     Neuro/Psych negative neurological ROS  negative psych ROS   GI/Hepatic negative GI ROS, Neg liver ROS,   Endo/Other  negative endocrine ROS  Renal/GU negative Renal ROS  negative genitourinary   Musculoskeletal negative musculoskeletal ROS (+)   Abdominal   Peds negative pediatric ROS (+)  Hematology negative hematology ROS (+)   Anesthesia Other Findings   Reproductive/Obstetrics negative OB ROS                             Anesthesia Physical Anesthesia Plan  ASA: II  Anesthesia Plan: General   Post-op Pain Management:    Induction: Intravenous  Airway Management Planned: Oral ETT  Additional Equipment:   Intra-op Plan:   Post-operative Plan: Extubation in OR  Informed Consent: I have reviewed the patients History and Physical, chart, labs and discussed the procedure including the risks, benefits and alternatives for the proposed anesthesia with the patient or authorized representative who has indicated his/her understanding and acceptance.   Dental advisory given  Plan Discussed with: CRNA  Anesthesia Plan Comments:         Anesthesia Quick Evaluation

## 2016-09-15 NOTE — Anesthesia Procedure Notes (Signed)
Procedure Name: Intubation Date/Time: 09/15/2016 2:58 PM Performed by: Gavin PoundLOWDER, Rajiv Parlato J Pre-anesthesia Checklist: Patient identified, Emergency Drugs available, Suction available and Timeout performed Patient Re-evaluated:Patient Re-evaluated prior to inductionOxygen Delivery Method: Circle system utilized Preoxygenation: Pre-oxygenation with 100% oxygen Intubation Type: IV induction Ventilation: Mask ventilation without difficulty Laryngoscope Size: Mac and 4 Grade View: Grade II Tube type: Oral Tube size: 7.5 mm Number of attempts: 1 Placement Confirmation: ETT inserted through vocal cords under direct vision,  positive ETCO2 and breath sounds checked- equal and bilateral Secured at: 21 cm Tube secured with: Tape Dental Injury: Teeth and Oropharynx as per pre-operative assessment

## 2016-09-16 DIAGNOSIS — M48061 Spinal stenosis, lumbar region without neurogenic claudication: Secondary | ICD-10-CM | POA: Diagnosis not present

## 2016-09-16 MED ORDER — HYDROMORPHONE HCL 2 MG PO TABS
4.0000 mg | ORAL_TABLET | ORAL | Status: DC | PRN
  Administered 2016-09-16 – 2016-09-17 (×3): 4 mg via ORAL
  Filled 2016-09-16 (×3): qty 2

## 2016-09-16 MED ORDER — OXYCODONE-ACETAMINOPHEN 5-325 MG PO TABS
1.0000 | ORAL_TABLET | ORAL | Status: DC | PRN
  Filled 2016-09-16: qty 2

## 2016-09-16 NOTE — Op Note (Signed)
NAMMarland Kitchen:  Keane PoliceWILLIAMS, Mechel        ACCOUNT NO.:  [id number]  MEDICAL RECORD NO.:  [id number]  LOCATION:  5C06C                        FACILITY:  [facility]  PHYSICIAN:  Hilda LiasErnesto Shlomie Romig, M.D.   DATE OF BIRTH:  Oct 02, 1961  DATE OF PROCEDURE:  09/15/2016 DATE OF DISCHARGE:                              OPERATIVE REPORT   PREOPERATIVE DIAGNOSIS:  Lumbar stenosis with radiculopathy.  Status post laminotomy and diskectomy L4-5, right side.  POSTOPERATIVE DIAGNOSIS:  Lumbar stenosis with radiculopathy.  Status post laminotomy and diskectomy L4-5, right side.  PROCEDURES:  Bilateral L4-L5 laminectomy.  Decompression of the thecal sac.  Foraminotomy to decompress the L4, L5, S1 nerve roots.  Lysis of adhesion.  Repair of durotomy.  Microscope.  SURGEON:  Hilda LiasErnesto Miangel Flom, M.D.  CLINICAL HISTORY:  [name] is a gentleman who had been complaining of back pain radiation to both legs.  He is not any better with conservative treatment.  Lumbar myelogram followed by a CT scan showed foraminal narrow at the level of 4-5 and 5-1.  He wants to proceed with surgery.  We talked about laminectomy with laminotomy and at the end, we decided with laminectomy and foraminotomy.  He and his wife knew the risk and benefit of surgery.  DESCRIPTION OF PROCEDURE:  The patient was taken to the OR, and after intubation, he was positioned in a prone manner.  The back was cleaned with DuraPrep.  Drapes were applied.  Midline incision following the previous one was made from L4 to L5-S1.  With the Leksell, we removed the spinous process of L4 and L5.  With the help of the microscope, we started drilling the lamina bilaterally at the level of 4-5 and 5-1.  In the right side, we found quite a bit of adhesion compromising the takeoff of the L4 and L5 nerve roots.  Decompression was made.  There was a small opening in the dura mater, which was taken care with a single stitch of Prolene.  From then on, using the 1, 2 and  3-mm Kerrison punch, we opened the foramen to decompress the L4, L5 and S1 nerve root.  At the end, we have a good decompression.  Valsalva maneuver twice up to 40 was negative.  Then, surgical glue was left in the epidural space.  The area was irrigated.  Hemostasis was done and the wound was closed with Vicryl and staple.          ______________________________ Hilda LiasErnesto Enoc Getter, M.D.     EB/MEDQ  D:  09/15/2016  T:  09/16/2016  Job:  [id number]

## 2016-09-16 NOTE — Progress Notes (Signed)
Patient ID: Russell Gillespie, male   DOB: January 25, 1961, 55 y.o.   MRN: [id number] Vital signs are stable Motor function is good in lower extremities Patient cannot void well lying flat in bed Will replace Foley catheter Patient is not using PCA much at all Will discontinue PCA

## 2016-09-16 NOTE — Progress Notes (Signed)
Patient ID: Russell FloorChristopher L Weich, male   DOB: [birth year], 55 y.o.   MRN: [id number] Doing well flat in bed not 5 degrees down as ordered BUT somehow the order written postop is not present!!!!! Patient stable no weakness. Pain as preop gone. Spoke with RN  To keep him flat down 5 degrees can be rolled side to side

## 2016-09-17 DIAGNOSIS — M48061 Spinal stenosis, lumbar region without neurogenic claudication: Secondary | ICD-10-CM | POA: Diagnosis not present

## 2016-09-17 MED ORDER — DOCUSATE SODIUM 100 MG PO CAPS
100.0000 mg | ORAL_CAPSULE | Freq: Two times a day (BID) | ORAL | Status: DC
  Administered 2016-09-17 – 2016-09-18 (×3): 100 mg via ORAL
  Filled 2016-09-17 (×3): qty 1

## 2016-09-17 MED ORDER — BUTALBITAL-APAP-CAFFEINE 50-325-40 MG PO TABS
1.0000 | ORAL_TABLET | Freq: Four times a day (QID) | ORAL | Status: DC | PRN
  Administered 2016-09-17: 2 via ORAL
  Administered 2016-09-17: 1 via ORAL
  Administered 2016-09-17: 2 via ORAL
  Administered 2016-09-17: 1 via ORAL
  Administered 2016-09-18: 2 via ORAL
  Filled 2016-09-17 (×3): qty 2
  Filled 2016-09-17 (×2): qty 1

## 2016-09-17 NOTE — Progress Notes (Signed)
Patient ID: Paulene FloorChristopher L Wiltsey, male   DOB: 1961/10/17, 55 y.o.   MRN: [id number] Subjective: Patient reports significant headache since last night when put in Trendelenburg. He actually felt better flat. No back pain or leg pain. No numbness tingling or weakness. Fioricet this morning seemed to help.  Objective: Vital signs in last 24 hours: Temp:  [97.8 F (36.6 C)-99 F (37.2 C)] 99 F (37.2 C) (10/08 1008) Pulse Rate:  [37-78] 37 (10/08 1008) Resp:  [12-18] 18 (10/08 1008) BP: (127-138)/(61-92) 137/72 (10/08 1008) SpO2:  [94 %-98 %] 97 % (10/08 1008)  Intake/Output from previous day: 10/07 0701 - 10/08 0700 In: -  Out: 1625 [Urine:1625] Intake/Output this shift: No intake/output data recorded.  Neurologic: Grossly normal, dressing dry and flat, good strength in the legs  Lab Results: Lab Results  Component Value Date   WBC 4.3 09/15/2016   HGB 15.2 09/15/2016   HCT 45.1 09/15/2016   MCV 88.4 09/15/2016   PLT 196 09/15/2016   Lab Results  Component Value Date   INR 0.95 04/25/2016   BMET Lab Results  Component Value Date   NA 140 09/15/2016   K 4.1 09/15/2016   CL 106 09/15/2016   CO2 29 09/15/2016   GLUCOSE 121 (H) 09/15/2016   BUN 13 09/15/2016   CREATININE 0.79 09/15/2016   CALCIUM 9.5 09/15/2016    Studies/Results: Dg Lumbar Spine 1 View  Result Date: 09/15/2016 CLINICAL DATA:  L4-5 and L5-S1 laminectomy EXAM: LUMBAR SPINE - 1 VIEW COMPARISON:  CT from 08/25/2016, myelogram from 08/25/2016 FINDINGS: Numbering per prior CT and pulmonary exams. Two metallic probes are is seen projecting over the posterior elements of the lumbar spine, the more cephalad is seen at the pedicle level of L5 and the more caudad at the superior endplate level of S1. IMPRESSION: Intraoperative probes at the L5 pedicle level and the second at the S1 superior endplate level Electronically Signed   By: Tollie Ethavid  Kwon M.D.   On: 09/15/2016 17:20    Assessment/Plan: I took the bed  out of Trendelenburg and flattened it since he felt better flat. Continue flat bed rest for now. May be out of bed tomorrow.   LOS: 2 days    JONES,DAVID S 09/17/2016, 10:40 AM

## 2016-09-18 ENCOUNTER — Encounter (HOSPITAL_COMMUNITY): Payer: Self-pay | Admitting: Neurosurgery

## 2016-09-18 DIAGNOSIS — M48061 Spinal stenosis, lumbar region without neurogenic claudication: Secondary | ICD-10-CM | POA: Diagnosis not present

## 2016-09-18 NOTE — Progress Notes (Signed)
Patient discharged via wheelchair from room 5C-06.  Patient read discharge instructions, patient verbalized understanding.  All patient's personal items taken with patient.

## 2016-09-18 NOTE — Progress Notes (Signed)
Verbal order given by MD to gradually increase head of bed until patient is in a sitting up position without any problems. Patient's head of bed is now at 45 degrees. Will continue to progress elevation.

## 2016-09-18 NOTE — Progress Notes (Signed)
Patient is sitting up in chair with aspen lumbar brace on

## 2016-09-18 NOTE — Progress Notes (Signed)
Orthopedic Tech Progress Note Patient Details:  Paulene FloorChristopher L Schlie 07/10/1961 [id number]  Patient ID: Paulene Floorhristopher L Laux, male   DOB: 07/10/1961, 55 y.o.   MRN: [id number]   Nikki DomCrawford, Tasmin Exantus 09/18/2016, 11:27 AM Called in bio-tech brace order; spoke with Judeth CornfieldStephanie

## 2016-09-18 NOTE — Care Management Note (Signed)
Case Management Note  Patient Details  Name: Russell Gillespie MRN: [id number] Date of Birth: Sep 10, 1961  Subjective/Objective:  Pt s/p lumbar surgery. He is from home with his spouse.                   Action/Plan: Plan is for patient to discharge home today with his spouse. No further needs per CM.   Expected Discharge Date:   (Pending)               Expected Discharge Plan:  Home/Self Care  In-House Referral:     Discharge planning Services     Post Acute Care Choice:    Choice offered to:     DME Arranged:    DME Agency:     HH Arranged:    HH Agency:     Status of Service:  Completed, signed off  If discussed at [employer] Length of Stay Meetings, dates discussed:    Additional Comments:  Kermit BaloKelli F Jacksen Isip, RN 09/18/2016, 12:52 PM

## 2016-09-18 NOTE — Discharge Summary (Signed)
Physician Discharge Summary  Patient ID: Paulene FloorChristopher L Clagett MRN: [id number] DOB/AGE: Jun 23, 1961 55 y.o.  Admit date: 09/15/2016 Discharge date: 09/18/2016  Admission Diagnoses: Lumbar stenosis radicujlopatghy Discharge Diagnoses:  Active Problems:   Lumbar stenosis   Discharged Condition: no pain or weakness.  Hospital Course: surgery  Consults: none  Significant Diagnostic Studies:myelogram  Treatments: lumbar laminectomies, repair durstotomy  Discharge Exam: Blood pressure 124/84, pulse 67, temperature 99 F (37.2 C), temperature source Oral, resp. rate 20, height 6\' 1"  (1.854 m), weight 101.6 kg (224 lb), SpO2 98 %. No weakness.  Disposition: home      Signed: Karn CassisBOTERO,Janis Cuffe M 09/18/2016, 9:48 AM

## 2016-11-10 ENCOUNTER — Encounter (HOSPITAL_COMMUNITY): Payer: Self-pay

## 2016-11-10 ENCOUNTER — Emergency Department (HOSPITAL_COMMUNITY)
Admission: EM | Admit: 2016-11-10 | Discharge: 2016-11-11 | Disposition: A | Discharge status: Home / Self Care | Payer: Managed Care, Other (non HMO) | Source: Home / Self Care | Attending: Emergency Medicine | Admitting: Emergency Medicine

## 2016-11-10 ENCOUNTER — Emergency Department (HOSPITAL_COMMUNITY): Payer: Managed Care, Other (non HMO)

## 2016-11-10 DIAGNOSIS — Z7982 Long term (current) use of aspirin: Secondary | ICD-10-CM

## 2016-11-10 DIAGNOSIS — G9600 Cerebrospinal fluid leak, unspecified: Secondary | ICD-10-CM

## 2016-11-10 DIAGNOSIS — Z79899 Other long term (current) drug therapy: Secondary | ICD-10-CM

## 2016-11-10 DIAGNOSIS — R51 Headache: Secondary | ICD-10-CM | POA: Diagnosis not present

## 2016-11-10 DIAGNOSIS — Z96652 Presence of left artificial knee joint: Secondary | ICD-10-CM

## 2016-11-10 DIAGNOSIS — M5416 Radiculopathy, lumbar region: Secondary | ICD-10-CM

## 2016-11-10 DIAGNOSIS — Z87891 Personal history of nicotine dependence: Secondary | ICD-10-CM | POA: Insufficient documentation

## 2016-11-10 DIAGNOSIS — Z96641 Presence of right artificial hip joint: Secondary | ICD-10-CM | POA: Insufficient documentation

## 2016-11-10 DIAGNOSIS — N189 Chronic kidney disease, unspecified: Secondary | ICD-10-CM

## 2016-11-10 DIAGNOSIS — M549 Dorsalgia, unspecified: Secondary | ICD-10-CM

## 2016-11-10 LAB — CBC WITH DIFFERENTIAL/PLATELET
BASOS ABS: 0.1 10*3/uL (ref 0.0–0.1)
BASOS PCT: 1 %
Eosinophils Absolute: 0.1 10*3/uL (ref 0.0–0.7)
Eosinophils Relative: 1 %
HEMATOCRIT: 33.9 % — AB (ref 39.0–52.0)
HEMOGLOBIN: 11.9 g/dL — AB (ref 13.0–17.0)
LYMPHS PCT: 20 %
Lymphs Abs: 1.9 10*3/uL (ref 0.7–4.0)
MCH: 30.4 pg (ref 26.0–34.0)
MCHC: 35.1 g/dL (ref 30.0–36.0)
MCV: 86.5 fL (ref 78.0–100.0)
MONO ABS: 0.4 10*3/uL (ref 0.1–1.0)
MONOS PCT: 4 %
NEUTROS ABS: 7.3 10*3/uL (ref 1.7–7.7)
NEUTROS PCT: 74 %
Platelets: 230 10*3/uL (ref 150–400)
RBC: 3.92 MIL/uL — ABNORMAL LOW (ref 4.22–5.81)
RDW: 12.4 % (ref 11.5–15.5)
WBC: 9.7 10*3/uL (ref 4.0–10.5)

## 2016-11-10 LAB — BASIC METABOLIC PANEL
ANION GAP: 8 (ref 5–15)
BUN: 11 mg/dL (ref 6–20)
CHLORIDE: 97 mmol/L — AB (ref 101–111)
CO2: 23 mmol/L (ref 22–32)
Calcium: 8.8 mg/dL — ABNORMAL LOW (ref 8.9–10.3)
Creatinine, Ser: 0.7 mg/dL (ref 0.61–1.24)
GFR calc non Af Amer: >60 mL/min (ref >60)
GLUCOSE: 95 mg/dL (ref 65–99)
Potassium: 3.8 mmol/L (ref 3.5–5.1)
Sodium: 128 mmol/L — ABNORMAL LOW (ref 135–145)

## 2016-11-10 LAB — SEDIMENTATION RATE: Sed Rate: 4 mm/hr (ref 0–16)

## 2016-11-10 MED ORDER — METHOCARBAMOL 1000 MG/10ML IJ SOLN
1000.0000 mg | Freq: Once | INTRAMUSCULAR | Status: DC
  Filled 2016-11-10: qty 10

## 2016-11-10 MED ORDER — HYDROMORPHONE HCL 1 MG/ML IJ SOLN
0.5000 mg | Freq: Once | INTRAMUSCULAR | Status: AC
  Administered 2016-11-10: 0.5 mg via INTRAVENOUS
  Filled 2016-11-10: qty 1

## 2016-11-10 MED ORDER — METHOCARBAMOL 1000 MG/10ML IJ SOLN
1000.0000 mg | Freq: Once | INTRAMUSCULAR | Status: AC
  Administered 2016-11-10: 1000 mg via INTRAVENOUS
  Filled 2016-11-10: qty 10

## 2016-11-10 MED ORDER — SODIUM CHLORIDE 0.9 % IV BOLUS (SEPSIS)
1000.0000 mL | Freq: Once | INTRAVENOUS | Status: AC
  Administered 2016-11-10: 1000 mL via INTRAVENOUS

## 2016-11-10 MED ORDER — ONDANSETRON HCL 4 MG/2ML IJ SOLN
4.0000 mg | Freq: Once | INTRAMUSCULAR | Status: AC
  Administered 2016-11-10: 4 mg via INTRAVENOUS
  Filled 2016-11-10: qty 2

## 2016-11-10 MED ORDER — KETOROLAC TROMETHAMINE 30 MG/ML IJ SOLN
30.0000 mg | Freq: Once | INTRAMUSCULAR | Status: AC
  Administered 2016-11-10: 30 mg via INTRAVENOUS
  Filled 2016-11-10: qty 1

## 2016-11-10 MED ORDER — HYDROMORPHONE HCL 1 MG/ML IJ SOLN
1.0000 mg | Freq: Once | INTRAMUSCULAR | Status: AC
  Administered 2016-11-10: 1 mg via INTRAVENOUS
  Filled 2016-11-10: qty 1

## 2016-11-10 MED ORDER — GADOBENATE DIMEGLUMINE 529 MG/ML IV SOLN
20.0000 mL | Freq: Once | INTRAVENOUS | Status: AC | PRN
  Administered 2016-11-11: 20 mL via INTRAVENOUS

## 2016-11-10 NOTE — ED Notes (Signed)
Patient transported to MRI 

## 2016-11-10 NOTE — ED Triage Notes (Signed)
Per [facility]- Pt with HX of back pain. Surgery 2 months ago. Physical therapy started yesterday. Pt awoke unable to move. Pain radiated up neck and down rt leg with tingling in rt leg. No loss of bowel or bladder. Pt took 1/2 oxy at 4612 with no relief.

## 2016-11-10 NOTE — ED Notes (Signed)
Bed: WA06 Expected date:  Expected time:  Means of arrival:  Comments: EMS-back pain/recent surgery

## 2016-11-10 NOTE — ED Provider Notes (Signed)
[facility] DEPT Provider Note   CSN: [id number] Arrival date & time: 11/10/16  1806     History   Chief Complaint Chief Complaint  Patient presents with  . Back Pain    surgery 2 months    HPI Russell Gillespie is a 55 y.o. male who presents with back pain. PMH significant for lumbar stenosis s/p laminotomy and diskectomy L4-5 and recent bilateral L4-5 laminectomy, decompression of the thecal sac, and foraminotomy to decompress L4, L5, and S1 nerve roots. Surgery was on 10/7 by Dr. Joya Salm. The patient reports a difficulty post-op period. Over the past several weeks he has had worsening low back pain and frequent "spinal headaches". Patient was noted to have a CSF leak after surgery. Yesterday he had his first session of physical therapy and felt sore afterwards. Today he had an acute worsening of pain after he tried to get in his truck and started to have radicular pain which is similar to when he had a herniated disc many years ago. He took his prescribed pain medicine without relief and the pain was so severe that EMS was called to transport. Additionally he reports having swelling over the lower back over the incision site. This has been evaluated by Dr. Joya Salm who stated that it was normal. He reports associated numbness around the anus with no frank bowel/bladder incontinence or urinary retention as well as difficulty walking due to pain. No leg weakness. Denies fever, chills, syncope, trauma.  HPI  Past Medical History:  Diagnosis Date  . Chronic kidney disease    kidney stones  . Dysrhythmia    occasional irregular HR  . Family history of adverse reaction to anesthesia    Mother gets PONV  . Hemorrhoids   . Internal thrombosed hemorrhoid-left lateral 05/23/2012  . Irregular heart beat   . Radiculopathy of lumbar region   . Tinnitus   . Tuberculosis    tests positively on TB skin tests    Patient Active Problem List   Diagnosis Date Noted  . Lumbar stenosis  09/15/2016  . OA (osteoarthritis) of hip 05/03/2016  . Internal thrombosed hemorrhoid-left lateral 05/23/2012    Past Surgical History:  Procedure Laterality Date  . BACK SURGERY  2006  . COLONOSCOPY    . LUMBAR LAMINECTOMY/DECOMPRESSION MICRODISCECTOMY Bilateral 09/15/2016   Procedure: Bilateral Lumbar four - Lumber Five, Lumbar Five - Sacral One Laminectomy/Foraminotomy;  Surgeon: Leeroy Cha, MD;  Location: [facility];  Service: Neurosurgery;  Laterality: Bilateral;  Bilateral Lumbar four - Lumber Five, Lumbar Five - Sacral One Laminectomy/Foraminotomy  . ROTATOR CUFF REPAIR  2013  . TONSILLECTOMY    . TOTAL HIP ARTHROPLASTY Right 05/03/2016   Procedure: RIGHT TOTAL HIP ARTHROPLASTY ANTERIOR APPROACH;  Surgeon: Gaynelle Arabian, MD;  Location: [facility];  Service: Orthopedics;  Laterality: Right;  . TOTAL KNEE ARTHROPLASTY     left       Home Medications    Prior to Admission medications   Medication Sig Start Date End Date Taking? Authorizing Provider  acetaminophen (TYLENOL) 500 MG tablet Take 1,000 mg by mouth every 6 (six) hours as needed (For pain.).    Historical Provider, MD  aspirin EC 81 MG tablet Take 81 mg by mouth daily.    Historical Provider, MD  cyclobenzaprine (FLEXERIL) 5 MG tablet Take 5 mg by mouth 3 (three) times daily as needed for muscle spasms.  08/31/16   Historical Provider, MD  fluticasone (FLONASE) 50 MCG/ACT nasal spray Place 1 spray into both nostrils  daily as needed for allergies.    Historical Provider, MD  methocarbamol (ROBAXIN) 500 MG tablet Take 1 tablet (500 mg total) by mouth every 6 (six) hours as needed for muscle spasms. Patient not taking: Reported on 09/14/2016 05/04/16   Alexzandrew L Perkins, PA-C  Omega-3 Fatty Acids (FISH OIL PO) Take 1 capsule by mouth daily. Has stopped prior to procedure    Historical Provider, MD  oxyCODONE (OXY IR/ROXICODONE) 5 MG immediate release tablet Take 1-2 tablets (5-10 mg total) by mouth every 3 (three) hours as  needed for moderate pain or severe pain. 05/04/16   Alexzandrew L Perkins, PA-C  VYVANSE 60 MG capsule Take 60 mg by mouth every morning. 08/04/16   Historical Provider, MD    Family History Family History  Problem Relation Age of Onset  . Diabetes Mother   . Hypertension Mother   . Dementia Father     Social History Social History  Substance Use Topics  . Smoking status: Former Smoker    Types: Cigarettes  . Smokeless tobacco: Former Systems developer    Quit date: 04/25/1997  . Alcohol use Yes     Comment: occasionally     Allergies   Patient has no known allergies.   Review of Systems Review of Systems   Physical Exam Updated Vital Signs BP 124/71 (BP Location: Right Arm)   Pulse (!) 35   Temp 100.4 F (38 C) (Oral)   Resp 21   Ht 6' 1"  (1.854 m)   Wt 101.6 kg   SpO2 94%   BMI 29.55 kg/m   Physical Exam  Constitutional: He is oriented to person, place, and time. He appears well-developed and well-nourished. No distress.  HENT:  Head: Normocephalic and atraumatic.  Eyes: Conjunctivae are normal. Pupils are equal, round, and reactive to light. Right eye exhibits no discharge. Left eye exhibits no discharge. No scleral icterus.  Neck: Normal range of motion.  Cardiovascular: Normal rate.   Pulmonary/Chest: Effort normal. No respiratory distress.  Abdominal: He exhibits no distension.  Genitourinary:  Genitourinary Comments: Rectal: Rectal tone present. Subjective numbness around right side of rectum. Chaperone present during exam.   Musculoskeletal:  Back: Inspection: 5cm soft, circular mass over incision site. Incision site does not appear to be infected Palpation: Lumbar midline spinal tenderness over incision site and over R sciatic notch. Strength: 5/5 in lower extremities and normal plantar and dorsiflexion Sensation: Reported decreased sensation of right side of rectum and over the right lateral foot. Gait: Deferred Reflexes: Patellar reflex is 2+  bilaterally SLR: Positive seated straight leg raise on right side   Neurological: He is alert and oriented to person, place, and time.  Skin: Skin is warm and dry.  Psychiatric: He has a normal mood and affect. His behavior is normal.  Nursing note and vitals reviewed.    ED Treatments / Results  Labs (all labs ordered are listed, but only abnormal results are displayed) Labs Reviewed  CBC WITH DIFFERENTIAL/PLATELET - Abnormal; Notable for the following:       Result Value   RBC 3.92 (*)    Hemoglobin 11.9 (*)    HCT 33.9 (*)    All other components within normal limits  BASIC METABOLIC PANEL - Abnormal; Notable for the following:    Sodium 128 (*)    Chloride 97 (*)    Calcium 8.8 (*)    All other components within normal limits  SEDIMENTATION RATE  C-REACTIVE PROTEIN    EKG  EKG Interpretation  None       Radiology No results found.  Procedures Procedures (including critical care time)  Medications Ordered in ED Medications  methocarbamol (ROBAXIN) 1,000 mg in dextrose 5 % 50 mL IVPB (1,000 mg Intravenous New Bag/Given 11/10/16 2108)  HYDROmorphone (DILAUDID) injection 1 mg (1 mg Intravenous Given 11/10/16 1929)  ketorolac (TORADOL) 30 MG/ML injection 30 mg (30 mg Intravenous Given 11/10/16 1928)  HYDROmorphone (DILAUDID) injection 0.5 mg (0.5 mg Intravenous Given 11/10/16 2109)  ondansetron (ZOFRAN) injection 4 mg (4 mg Intravenous Given 11/10/16 2109)     Initial Impression / Assessment and Plan / ED Course  I have reviewed the triage vital signs and the nursing notes.  Pertinent labs & imaging results that were available during my care of the patient were reviewed by me and considered in my medical decision making (see chart for details).  Clinical Course    55 year old male presents with acute back pain. Exam concerning for infection due to fever and mass on back. No leukocytosis. Pain controlled in ED. Patient has good strength and reflexes on exam although  he does have rectal numbness - less concern for cauda equina. ESR and CRP pending.  Spoke with Dr. Cyndy Freeze with [facility] who recommends MRI with contrast and for him to be updated of results.  Patient signed out to Lissa Hoard, NP pending MRI results.  Final Clinical Impressions(s) / ED Diagnoses   Final diagnoses:  Back pain    New Prescriptions New Prescriptions   No medications on file     Recardo Evangelist, PA-C 11/10/16 2112    Fatima Blank, MD 11/12/16 0010

## 2016-11-11 LAB — C-REACTIVE PROTEIN

## 2016-11-11 MED ORDER — HYDROMORPHONE HCL 1 MG/ML IJ SOLN
1.0000 mg | Freq: Once | INTRAMUSCULAR | Status: AC
  Administered 2016-11-11: 1 mg via INTRAVENOUS

## 2016-11-11 MED ORDER — METHYLPREDNISOLONE SODIUM SUCC 125 MG IJ SOLR
INTRAMUSCULAR | Status: AC
  Administered 2016-11-11: 60 mg via INTRAVENOUS
  Filled 2016-11-11: qty 2

## 2016-11-11 MED ORDER — METHYLPREDNISOLONE 4 MG PO TBPK: ORAL_TABLET | ORAL | 0 refills | Status: DC

## 2016-11-11 MED ORDER — HYDROMORPHONE HCL 1 MG/ML IJ SOLN
INTRAMUSCULAR | Status: AC
  Administered 2016-11-11: 1 mg via INTRAVENOUS
  Filled 2016-11-11: qty 1

## 2016-11-11 MED ORDER — METHYLPREDNISOLONE SODIUM SUCC 125 MG IJ SOLR
60.0000 mg | Freq: Once | INTRAMUSCULAR | Status: AC
  Administered 2016-11-11: 60 mg via INTRAVENOUS

## 2016-11-11 NOTE — Discharge Instructions (Signed)
As discussed.  Dr. Bevely Palmeritty, the neurosurgeon reviewed your MRI.  He feels that this is a CSF fluid collection putting pressure on a nerve causing the radiculopathy.  His recommendation is for you to be placed on a steroid taper, pain control and follow-up with Dr. Ellwood HandlerBertero on Monday

## 2016-11-11 NOTE — ED Provider Notes (Signed)
I reviewed MRI scan.  Discussed this with Dr. Bevely Palmerditty neurosurgery who feels that this is a CSF fluid collection and will reabsorb on its own.  As far as patient's symptoms.  He feels that he can be started on a steroid taper and follow-up in the office. This has been discussed with patient and family   Earley FavorGail Brisa Auth, NP 11/11/16 0342    Earley FavorGail Skyllar Notarianni, NP 11/11/16 [id number]    Blane OharaJoshua Zavitz, MD 11/11/16 [phone]

## 2016-11-13 ENCOUNTER — Inpatient Hospital Stay (HOSPITAL_COMMUNITY)
Admission: AD | Admit: 2016-11-13 | Discharge: 2016-11-19 | DRG: 030 | Disposition: A | Discharge status: Home / Self Care | Payer: Managed Care, Other (non HMO) | Source: Ambulatory Visit | Attending: Neurosurgery | Admitting: Neurosurgery

## 2016-11-13 ENCOUNTER — Observation Stay (HOSPITAL_COMMUNITY): Payer: Managed Care, Other (non HMO)

## 2016-11-13 ENCOUNTER — Other Ambulatory Visit: Payer: Self-pay | Admitting: Neurosurgery

## 2016-11-13 ENCOUNTER — Observation Stay (HOSPITAL_COMMUNITY): Payer: Managed Care, Other (non HMO) | Admitting: Anesthesiology

## 2016-11-13 ENCOUNTER — Encounter (HOSPITAL_COMMUNITY): Payer: Self-pay

## 2016-11-13 ENCOUNTER — Encounter (HOSPITAL_COMMUNITY)
Admission: AD | Disposition: A | Discharge status: Home / Self Care | Payer: Self-pay | Source: Ambulatory Visit | Attending: Neurosurgery

## 2016-11-13 DIAGNOSIS — G960 Cerebrospinal fluid leak: Principal | ICD-10-CM | POA: Diagnosis present

## 2016-11-13 DIAGNOSIS — Z96659 Presence of unspecified artificial knee joint: Secondary | ICD-10-CM | POA: Diagnosis present

## 2016-11-13 DIAGNOSIS — G9619 Other disorders of meninges, not elsewhere classified: Secondary | ICD-10-CM | POA: Diagnosis present

## 2016-11-13 DIAGNOSIS — Z79899 Other long term (current) drug therapy: Secondary | ICD-10-CM | POA: Diagnosis not present

## 2016-11-13 DIAGNOSIS — M5416 Radiculopathy, lumbar region: Secondary | ICD-10-CM | POA: Diagnosis present

## 2016-11-13 DIAGNOSIS — Z87891 Personal history of nicotine dependence: Secondary | ICD-10-CM

## 2016-11-13 DIAGNOSIS — R51 Headache: Secondary | ICD-10-CM | POA: Diagnosis present

## 2016-11-13 DIAGNOSIS — Z419 Encounter for procedure for purposes other than remedying health state, unspecified: Secondary | ICD-10-CM

## 2016-11-13 HISTORY — PX: LUMBAR WOUND DEBRIDEMENT: SHX1988

## 2016-11-13 LAB — CBC
HEMATOCRIT: 38.6 % — AB (ref 39.0–52.0)
Hemoglobin: 13 g/dL (ref 13.0–17.0)
MCH: 30.4 pg (ref 26.0–34.0)
MCHC: 33.7 g/dL (ref 30.0–36.0)
MCV: 90.4 fL (ref 78.0–100.0)
PLATELETS: 250 10*3/uL (ref 150–400)
RBC: 4.27 MIL/uL (ref 4.22–5.81)
RDW: 12.9 % (ref 11.5–15.5)
WBC: 11 10*3/uL — AB (ref 4.0–10.5)

## 2016-11-13 LAB — CREATININE, SERUM: CREATININE: 0.94 mg/dL (ref 0.61–1.24)

## 2016-11-13 SURGERY — LUMBAR WOUND DEBRIDEMENT
Anesthesia: General | Site: Spine Lumbar

## 2016-11-13 MED ORDER — THROMBIN 5000 UNITS EX SOLR
CUTANEOUS | Status: AC
  Filled 2016-11-13: qty 5000

## 2016-11-13 MED ORDER — FLUTICASONE PROPIONATE 50 MCG/ACT NA SUSP
1.0000 | Freq: Every day | NASAL | Status: DC | PRN
  Filled 2016-11-13: qty 16

## 2016-11-13 MED ORDER — LIDOCAINE HCL (CARDIAC) 20 MG/ML IV SOLN
INTRAVENOUS | Status: DC | PRN
  Administered 2016-11-13: 60 mg via INTRAVENOUS

## 2016-11-13 MED ORDER — DIAZEPAM 5 MG PO TABS: 10.0000 mg | ORAL_TABLET | Freq: Four times a day (QID) | ORAL | Status: DC | PRN

## 2016-11-13 MED ORDER — NEOSTIGMINE METHYLSULFATE 10 MG/10ML IV SOLN
INTRAVENOUS | Status: DC | PRN
  Administered 2016-11-13: 3 mg via INTRAVENOUS

## 2016-11-13 MED ORDER — ROCURONIUM BROMIDE 100 MG/10ML IV SOLN
INTRAVENOUS | Status: DC | PRN
  Administered 2016-11-13: 50 mg via INTRAVENOUS

## 2016-11-13 MED ORDER — PHENYLEPHRINE HCL 10 MG/ML IJ SOLN
INTRAMUSCULAR | Status: DC | PRN
  Administered 2016-11-13: 120 ug via INTRAVENOUS
  Administered 2016-11-13: 80 ug via INTRAVENOUS

## 2016-11-13 MED ORDER — MENTHOL 3 MG MT LOZG
1.0000 | LOZENGE | OROMUCOSAL | Status: DC | PRN
  Filled 2016-11-13: qty 9

## 2016-11-13 MED ORDER — 0.9 % SODIUM CHLORIDE (POUR BTL) OPTIME
TOPICAL | Status: DC | PRN
  Administered 2016-11-13: 200 mL

## 2016-11-13 MED ORDER — DIPHENHYDRAMINE HCL 50 MG/ML IJ SOLN: 12.5000 mg | Freq: Four times a day (QID) | INTRAMUSCULAR | Status: DC | PRN

## 2016-11-13 MED ORDER — PHENOL 1.4 % MT LIQD
1.0000 | OROMUCOSAL | Status: DC | PRN
Start: 2016-11-13 — End: 2016-11-19
  Filled 2016-11-13: qty 177

## 2016-11-13 MED ORDER — SENNA 8.6 MG PO TABS
1.0000 | ORAL_TABLET | Freq: Two times a day (BID) | ORAL | Status: DC
  Administered 2016-11-14 – 2016-11-19 (×11): 8.6 mg via ORAL
  Filled 2016-11-13 (×11): qty 1

## 2016-11-13 MED ORDER — HEPARIN SODIUM (PORCINE) 5000 UNIT/ML IJ SOLN
5000.0000 [IU] | Freq: Three times a day (TID) | INTRAMUSCULAR | Status: DC
  Administered 2016-11-14 – 2016-11-19 (×16): 5000 [IU] via SUBCUTANEOUS
  Filled 2016-11-13 (×15): qty 1

## 2016-11-13 MED ORDER — PROMETHAZINE HCL 25 MG/ML IJ SOLN
6.2500 mg | INTRAMUSCULAR | Status: DC | PRN
  Administered 2016-11-13: 6.25 mg via INTRAVENOUS

## 2016-11-13 MED ORDER — FENTANYL CITRATE (PF) 100 MCG/2ML IJ SOLN
INTRAMUSCULAR | Status: AC
  Filled 2016-11-13: qty 2

## 2016-11-13 MED ORDER — POVIDONE-IODINE 10 % OINT PACKET
TOPICAL_OINTMENT | CUTANEOUS | Status: DC | PRN
  Administered 2016-11-13: 1 via TOPICAL

## 2016-11-13 MED ORDER — ONDANSETRON HCL 4 MG/2ML IJ SOLN
4.0000 mg | INTRAMUSCULAR | Status: DC | PRN
  Administered 2016-11-15 – 2016-11-18 (×6): 4 mg via INTRAVENOUS
  Filled 2016-11-13 (×7): qty 2

## 2016-11-13 MED ORDER — EPHEDRINE SULFATE 50 MG/ML IJ SOLN
INTRAMUSCULAR | Status: DC | PRN
  Administered 2016-11-13 (×2): 5 mg via INTRAVENOUS
  Administered 2016-11-13: 10 mg via INTRAVENOUS

## 2016-11-13 MED ORDER — MIDAZOLAM HCL 2 MG/2ML IJ SOLN
INTRAMUSCULAR | Status: AC
  Filled 2016-11-13: qty 2

## 2016-11-13 MED ORDER — SODIUM CHLORIDE 0.9 % IV SOLN
INTRAVENOUS | Status: DC
  Administered 2016-11-13 – 2016-11-16 (×7): via INTRAVENOUS
  Administered 2016-11-18: 100 mL/h via INTRAVENOUS
  Administered 2016-11-18 (×2): via INTRAVENOUS

## 2016-11-13 MED ORDER — POVIDONE-IODINE 10 % EX OINT
TOPICAL_OINTMENT | CUTANEOUS | Status: AC
  Filled 2016-11-13: qty 28.35

## 2016-11-13 MED ORDER — LISDEXAMFETAMINE DIMESYLATE 30 MG PO CAPS
60.0000 mg | ORAL_CAPSULE | Freq: Every day | ORAL | Status: DC
  Filled 2016-11-13 (×2): qty 2

## 2016-11-13 MED ORDER — SULFAMETHOXAZOLE-TRIMETHOPRIM 400-80 MG PO TABS
1.0000 | ORAL_TABLET | Freq: Two times a day (BID) | ORAL | Status: DC
  Administered 2016-11-14 – 2016-11-19 (×10): 1 via ORAL
  Filled 2016-11-13 (×12): qty 1

## 2016-11-13 MED ORDER — ONDANSETRON HCL 4 MG/2ML IJ SOLN
INTRAMUSCULAR | Status: DC | PRN
  Administered 2016-11-13: 4 mg via INTRAVENOUS

## 2016-11-13 MED ORDER — SODIUM CHLORIDE 0.9% FLUSH
3.0000 mL | INTRAVENOUS | Status: DC | PRN
  Administered 2016-11-18: 3 mL via INTRAVENOUS
  Filled 2016-11-13: qty 3

## 2016-11-13 MED ORDER — HEMOSTATIC AGENTS (NO CHARGE) OPTIME
TOPICAL | Status: DC | PRN
  Administered 2016-11-13: 1 via TOPICAL

## 2016-11-13 MED ORDER — CEFAZOLIN IN D5W 1 GM/50ML IV SOLN
1.0000 g | Freq: Three times a day (TID) | INTRAVENOUS | Status: AC
  Administered 2016-11-14 (×2): 1 g via INTRAVENOUS
  Filled 2016-11-13 (×2): qty 50

## 2016-11-13 MED ORDER — SODIUM CHLORIDE 0.9% FLUSH: 9.0000 mL | INTRAVENOUS | Status: DC | PRN

## 2016-11-13 MED ORDER — NALOXONE HCL 0.4 MG/ML IJ SOLN: 0.4000 mg | INTRAMUSCULAR | Status: DC | PRN

## 2016-11-13 MED ORDER — LACTATED RINGERS IV SOLN
INTRAVENOUS | Status: DC
  Administered 2016-11-13 (×2): via INTRAVENOUS

## 2016-11-13 MED ORDER — MIDAZOLAM HCL 5 MG/5ML IJ SOLN
INTRAMUSCULAR | Status: DC | PRN
  Administered 2016-11-13: 2 mg via INTRAVENOUS

## 2016-11-13 MED ORDER — DIPHENHYDRAMINE HCL 12.5 MG/5ML PO ELIX: 12.5000 mg | ORAL_SOLUTION | Freq: Four times a day (QID) | ORAL | Status: DC | PRN

## 2016-11-13 MED ORDER — THROMBIN 5000 UNITS EX SOLR
CUTANEOUS | Status: DC | PRN
  Administered 2016-11-13: 5000 [IU] via TOPICAL

## 2016-11-13 MED ORDER — SODIUM CHLORIDE 0.9% FLUSH
3.0000 mL | Freq: Two times a day (BID) | INTRAVENOUS | Status: DC
  Administered 2016-11-14 – 2016-11-18 (×5): 3 mL via INTRAVENOUS

## 2016-11-13 MED ORDER — THROMBIN 5000 UNITS EX SOLR
CUTANEOUS | Status: DC | PRN
Start: 2016-11-13 — End: 2016-11-13
  Administered 2016-11-13: 5000 [IU] via TOPICAL

## 2016-11-13 MED ORDER — GLYCOPYRROLATE 0.2 MG/ML IJ SOLN
INTRAMUSCULAR | Status: DC | PRN
  Administered 2016-11-13: 0.4 mg via INTRAVENOUS

## 2016-11-13 MED ORDER — OXYCODONE-ACETAMINOPHEN 5-325 MG PO TABS
1.0000 | ORAL_TABLET | ORAL | Status: DC | PRN
  Administered 2016-11-16: 1 via ORAL
  Administered 2016-11-16: 2 via ORAL
  Administered 2016-11-17: 1 via ORAL
  Administered 2016-11-17 – 2016-11-18 (×3): 2 via ORAL
  Filled 2016-11-13: qty 1
  Filled 2016-11-13 (×5): qty 2

## 2016-11-13 MED ORDER — ACETAMINOPHEN 650 MG RE SUPP: 650.0000 mg | RECTAL | Status: DC | PRN

## 2016-11-13 MED ORDER — HYDROMORPHONE HCL 1 MG/ML IJ SOLN
0.2500 mg | INTRAMUSCULAR | Status: DC | PRN
  Administered 2016-11-13 (×4): 0.5 mg via INTRAVENOUS

## 2016-11-13 MED ORDER — HYDROMORPHONE HCL 2 MG/ML IJ SOLN
INTRAMUSCULAR | Status: AC
  Filled 2016-11-13: qty 1

## 2016-11-13 MED ORDER — FENTANYL CITRATE (PF) 100 MCG/2ML IJ SOLN
INTRAMUSCULAR | Status: DC | PRN
  Administered 2016-11-13: 100 ug via INTRAVENOUS
  Administered 2016-11-13 (×2): 50 ug via INTRAVENOUS

## 2016-11-13 MED ORDER — MORPHINE SULFATE 2 MG/ML IV SOLN
INTRAVENOUS | Status: DC
  Administered 2016-11-13: 22:00:00 via INTRAVENOUS
  Administered 2016-11-14: 18 mg via INTRAVENOUS
  Administered 2016-11-14: 9 mg via INTRAVENOUS
  Administered 2016-11-14: 7.5 mg via INTRAVENOUS
  Administered 2016-11-14: 9 mg via INTRAVENOUS
  Administered 2016-11-15: 3 mg via INTRAVENOUS
  Filled 2016-11-13: qty 25

## 2016-11-13 MED ORDER — LISDEXAMFETAMINE DIMESYLATE 30 MG PO CAPS
60.0000 mg | ORAL_CAPSULE | Freq: Every day | ORAL | Status: DC
Start: 2016-11-13 — End: 2016-11-13

## 2016-11-13 MED ORDER — ONDANSETRON HCL 4 MG/2ML IJ SOLN: 4.0000 mg | Freq: Four times a day (QID) | INTRAMUSCULAR | Status: DC | PRN

## 2016-11-13 MED ORDER — PROMETHAZINE HCL 25 MG/ML IJ SOLN
INTRAMUSCULAR | Status: AC
  Filled 2016-11-13: qty 1

## 2016-11-13 MED ORDER — PROPOFOL 10 MG/ML IV BOLUS
INTRAVENOUS | Status: DC | PRN
  Administered 2016-11-13: 200 mg via INTRAVENOUS

## 2016-11-13 MED ORDER — SODIUM CHLORIDE 0.9 % IV SOLN: 250.0000 mL | INTRAVENOUS | Status: DC

## 2016-11-13 MED ORDER — GABAPENTIN 300 MG PO CAPS
300.0000 mg | ORAL_CAPSULE | Freq: Every day | ORAL | Status: DC | PRN
  Administered 2016-11-15: 300 mg via ORAL
  Filled 2016-11-13: qty 1

## 2016-11-13 MED ORDER — CEFAZOLIN SODIUM-DEXTROSE 2-3 GM-% IV SOLR
INTRAVENOUS | Status: DC | PRN
  Administered 2016-11-13: 2 g via INTRAVENOUS

## 2016-11-13 MED ORDER — MORPHINE SULFATE 2 MG/ML IV SOLN
INTRAVENOUS | Status: AC
  Filled 2016-11-13: qty 25

## 2016-11-13 MED ORDER — ASPIRIN EC 81 MG PO TBEC
81.0000 mg | DELAYED_RELEASE_TABLET | Freq: Every day | ORAL | Status: DC
  Administered 2016-11-14 – 2016-11-19 (×5): 81 mg via ORAL
  Filled 2016-11-13 (×6): qty 1

## 2016-11-13 MED ORDER — ACETAMINOPHEN 325 MG PO TABS: 650.0000 mg | ORAL_TABLET | ORAL | Status: DC | PRN

## 2016-11-13 SURGICAL SUPPLY — 63 items
BENZOIN TINCTURE PRP APPL 2/3 (GAUZE/BANDAGES/DRESSINGS) IMPLANT
BLADE CLIPPER SURG (BLADE) IMPLANT
CANISTER SUCT 3000ML PPV (MISCELLANEOUS) ×3 IMPLANT
CARTRIDGE OIL MAESTRO DRILL (MISCELLANEOUS) ×1 IMPLANT
CLOSURE WOUND 1/2 X4 (GAUZE/BANDAGES/DRESSINGS)
DERMABOND ADVANCED (GAUZE/BANDAGES/DRESSINGS) ×2
DERMABOND ADVANCED .7 DNX12 (GAUZE/BANDAGES/DRESSINGS) ×1 IMPLANT
DIFFUSER DRILL AIR PNEUMATIC (MISCELLANEOUS) ×3 IMPLANT
DRAIN SUBARACHNOID (WOUND CARE) ×3 IMPLANT
DRAPE LAPAROTOMY 100X72X124 (DRAPES) ×3 IMPLANT
DRAPE MICROSCOPE LEICA (MISCELLANEOUS) ×3 IMPLANT
DRAPE POUCH INSTRU U-SHP 10X18 (DRAPES) ×3 IMPLANT
DRSG OPSITE POSTOP 4X6 (GAUZE/BANDAGES/DRESSINGS) ×3 IMPLANT
DURAPREP 26ML APPLICATOR (WOUND CARE) IMPLANT
ELECT REM PT RETURN 9FT ADLT (ELECTROSURGICAL) ×3
ELECTRODE REM PT RTRN 9FT ADLT (ELECTROSURGICAL) ×1 IMPLANT
GAUZE SPONGE 4X4 12PLY STRL (GAUZE/BANDAGES/DRESSINGS) ×3 IMPLANT
GAUZE SPONGE 4X4 16PLY XRAY LF (GAUZE/BANDAGES/DRESSINGS) IMPLANT
GLOVE BIO SURGEON STRL SZ 6.5 (GLOVE) IMPLANT
GLOVE BIO SURGEON STRL SZ7 (GLOVE) IMPLANT
GLOVE BIO SURGEON STRL SZ7.5 (GLOVE) IMPLANT
GLOVE BIO SURGEON STRL SZ8 (GLOVE) IMPLANT
GLOVE BIO SURGEON STRL SZ8.5 (GLOVE) IMPLANT
GLOVE BIO SURGEONS STRL SZ 6.5 (GLOVE)
GLOVE BIOGEL M 8.0 STRL (GLOVE) ×3 IMPLANT
GLOVE ECLIPSE 6.5 STRL STRAW (GLOVE) IMPLANT
GLOVE ECLIPSE 7.0 STRL STRAW (GLOVE) IMPLANT
GLOVE ECLIPSE 7.5 STRL STRAW (GLOVE) IMPLANT
GLOVE ECLIPSE 8.0 STRL XLNG CF (GLOVE) IMPLANT
GLOVE ECLIPSE 8.5 STRL (GLOVE) IMPLANT
GLOVE EXAM NITRILE LRG STRL (GLOVE) IMPLANT
GLOVE EXAM NITRILE XL STR (GLOVE) IMPLANT
GLOVE EXAM NITRILE XS STR PU (GLOVE) IMPLANT
GLOVE INDICATOR 6.5 STRL GRN (GLOVE) IMPLANT
GLOVE INDICATOR 7.0 STRL GRN (GLOVE) IMPLANT
GLOVE INDICATOR 7.5 STRL GRN (GLOVE) IMPLANT
GLOVE INDICATOR 8.0 STRL GRN (GLOVE) IMPLANT
GLOVE INDICATOR 8.5 STRL (GLOVE) IMPLANT
GLOVE OPTIFIT SS 8.0 STRL (GLOVE) IMPLANT
GLOVE SURG SS PI 6.5 STRL IVOR (GLOVE) IMPLANT
GOWN STRL REUS W/ TWL LRG LVL3 (GOWN DISPOSABLE) ×1 IMPLANT
GOWN STRL REUS W/ TWL XL LVL3 (GOWN DISPOSABLE) IMPLANT
GOWN STRL REUS W/TWL 2XL LVL3 (GOWN DISPOSABLE) IMPLANT
GOWN STRL REUS W/TWL LRG LVL3 (GOWN DISPOSABLE) ×2
GOWN STRL REUS W/TWL XL LVL3 (GOWN DISPOSABLE)
KIT BASIN OR (CUSTOM PROCEDURE TRAY) ×3 IMPLANT
KIT ROOM TURNOVER OR (KITS) ×3 IMPLANT
NS IRRIG 1000ML POUR BTL (IV SOLUTION) ×3 IMPLANT
OIL CARTRIDGE MAESTRO DRILL (MISCELLANEOUS) ×3
PACK LAMINECTOMY NEURO (CUSTOM PROCEDURE TRAY) ×3 IMPLANT
PAD ARMBOARD 7.5X6 YLW CONV (MISCELLANEOUS) ×9 IMPLANT
SPONGE SURGIFOAM ABS GEL SZ50 (HEMOSTASIS) ×3 IMPLANT
STRIP CLOSURE SKIN 1/2X4 (GAUZE/BANDAGES/DRESSINGS) IMPLANT
SUT PROLENE 6 0 BV (SUTURE) ×12 IMPLANT
SUT VIC AB 0 CT1 18XCR BRD8 (SUTURE) ×2 IMPLANT
SUT VIC AB 0 CT1 8-18 (SUTURE) ×4
SUT VIC AB 2-0 CP2 18 (SUTURE) ×3 IMPLANT
SUT VIC AB 3-0 SH 8-18 (SUTURE) ×3 IMPLANT
SWAB COLLECTION DEVICE MRSA (MISCELLANEOUS) ×3 IMPLANT
SWAB CULTURE ESWAB REG 1ML (MISCELLANEOUS) ×3 IMPLANT
TOWEL OR 17X24 6PK STRL BLUE (TOWEL DISPOSABLE) ×3 IMPLANT
TOWEL OR 17X26 10 PK STRL BLUE (TOWEL DISPOSABLE) ×3 IMPLANT
WATER STERILE IRR 1000ML POUR (IV SOLUTION) ×3 IMPLANT

## 2016-11-13 NOTE — Anesthesia Procedure Notes (Signed)
Procedure Name: Intubation Date/Time: 11/13/2016 6:35 PM Performed by: Adonis HousekeeperNGELL, Kongmeng Santoro M Pre-anesthesia Checklist: Patient identified, Emergency Drugs available, Suction available and Patient being monitored Patient Re-evaluated:Patient Re-evaluated prior to inductionOxygen Delivery Method: Circle system utilized Preoxygenation: Pre-oxygenation with 100% oxygen Intubation Type: IV induction Ventilation: Mask ventilation without difficulty and Oral airway inserted - appropriate to patient size Laryngoscope Size: Mac and 4 Grade View: Grade II Tube type: Oral Tube size: 7.5 mm Number of attempts: 1 Airway Equipment and Method: Stylet Placement Confirmation: ETT inserted through vocal cords under direct vision,  positive ETCO2 and breath sounds checked- equal and bilateral Secured at: 22 cm Tube secured with: Tape Dental Injury: Teeth and Oropharynx as per pre-operative assessment

## 2016-11-13 NOTE — Anesthesia Preprocedure Evaluation (Signed)
Anesthesia Evaluation  Patient identified by MRN, date of birth, ID band Patient awake    Reviewed: Allergy & Precautions, NPO status , Patient's Chart, lab work & pertinent test results  Airway Mallampati: II  TM Distance: >3 FB Neck ROM: Full    Dental no notable dental hx.    Pulmonary neg pulmonary ROS, former smoker,    Pulmonary exam normal breath sounds clear to auscultation       Cardiovascular negative cardio ROS Normal cardiovascular exam Rhythm:Regular Rate:Normal     Neuro/Psych negative neurological ROS  negative psych ROS   GI/Hepatic negative GI ROS, Neg liver ROS,   Endo/Other  negative endocrine ROS  Renal/GU negative Renal ROS  negative genitourinary   Musculoskeletal negative musculoskeletal ROS (+)   Abdominal   Peds negative pediatric ROS (+)  Hematology negative hematology ROS (+)   Anesthesia Other Findings   Reproductive/Obstetrics negative OB ROS                             Anesthesia Physical Anesthesia Plan  ASA: II  Anesthesia Plan: General   Post-op Pain Management:    Induction: Intravenous  Airway Management Planned: Oral ETT  Additional Equipment:   Intra-op Plan:   Post-operative Plan: Extubation in OR  Informed Consent: I have reviewed the patients History and Physical, chart, labs and discussed the procedure including the risks, benefits and alternatives for the proposed anesthesia with the patient or authorized representative who has indicated his/her understanding and acceptance.   Dental advisory given  Plan Discussed with: CRNA and Surgeon  Anesthesia Plan Comments:         Anesthesia Quick Evaluation  

## 2016-11-13 NOTE — OR Nursing (Signed)
Attempted to put a 16, 14, and 10 french foley in the patient and was not able to because of meeting resistance with the 16 and 14 and there was no return of urine with the 10 french foley.  PACU nurses will be informed by CRNA.

## 2016-11-13 NOTE — Progress Notes (Signed)
Spoke w/[facility] in short stay, they will call for pt soon to Or.  Delaney Meigsamara, NT is giving chlorhexadine bath now.  Wife is at bedside.

## 2016-11-13 NOTE — Transfer of Care (Signed)
Immediate Anesthesia Transfer of Care Note  Patient: Russell Gillespie  Procedure(s) Performed: Procedure(s) with comments: EXPLORATION OF LUMBAR WOUND AND REPAIR OF CSF LEAK (N/A) - EXPLORATION OF LUMBAR WOUND  Patient Location: PACU  Anesthesia Type:General  Level of Consciousness: awake, alert  and oriented  Airway & Oxygen Therapy: Patient Spontanous Breathing  Post-op Assessment: Report given to RN and Post -op Vital signs reviewed and stable  Post vital signs: Reviewed and stable  Last Vitals:  Vitals:   11/13/16 1516 11/13/16 2116  BP:    Pulse:    Resp:    Temp: 36.9 C (P) 37.1 C    Last Pain:  Vitals:   11/13/16 1516  TempSrc: Oral  PainSc:          Complications: No apparent anesthesia complications

## 2016-11-13 NOTE — Anesthesia Postprocedure Evaluation (Signed)
Anesthesia Post Note  Patient: Russell Gillespie  Procedure(s) Performed: Procedure(s) (LRB): EXPLORATION OF LUMBAR WOUND AND REPAIR OF CSF LEAK (N/A)  Patient location during evaluation: PACU Anesthesia Type: General Level of consciousness: awake and alert Pain management: pain level controlled Vital Signs Assessment: post-procedure vital signs reviewed and stable Respiratory status: spontaneous breathing, nonlabored ventilation, respiratory function stable and patient connected to nasal cannula oxygen Cardiovascular status: blood pressure returned to baseline and stable Postop Assessment: no signs of nausea or vomiting Anesthetic complications: no    Last Vitals:  Vitals:   11/13/16 2116 11/13/16 2117  BP:  127/77  Pulse:  64  Resp:  13  Temp: 37.1 C     Last Pain:  Vitals:   11/13/16 2116  TempSrc:   PainSc: 7                  Makari Portman S

## 2016-11-14 ENCOUNTER — Encounter (HOSPITAL_COMMUNITY): Payer: Self-pay | Admitting: Neurosurgery

## 2016-11-14 LAB — MRSA PCR SCREENING: MRSA by PCR: NEGATIVE

## 2016-11-14 MED ORDER — ORAL CARE MOUTH RINSE
15.0000 mL | Freq: Two times a day (BID) | OROMUCOSAL | Status: DC
  Administered 2016-11-14 – 2016-11-18 (×9): 15 mL via OROMUCOSAL

## 2016-11-14 NOTE — Progress Notes (Signed)
Patient ID: Russell Gillespie, male   DOB: 10-Feb-1961, 55 y.o.   MRN: [id number] Stable. Drain working well. No headache

## 2016-11-14 NOTE — Progress Notes (Signed)
Patient ID: Russell Gillespie, male   DOB: December 17, 1960, 55 y.o.   MRN: [id number] Stable, no headache. Lumbar drain working well. No weakness. Continue for at least till Friday. Patient fully aware

## 2016-11-14 NOTE — Progress Notes (Signed)
0400 - pt noted to have drained 25 cc CSF over the past hour (0300-0400). Pt complaining of 3/10 headache. Drain clamped; Dr. Lovell SheehanJenkins notified. Per Dr. Lovell SheehanJenkins, ok to clamp drain for an hour; after unclamped attempt to reclamp after 10 cc drained. Will continue to monitor closely.  Herma ArdMOSELEY, Yehia Mcbain F, RN

## 2016-11-14 NOTE — Progress Notes (Signed)
Pt arrived to 2S06 with closed lumbar drainage system intact, drainage bag marked for large volume output (75 cc). Current closed system does not have buretrol; unable monitor CSF output accurately.   Dr. Lovell SheehanJenkins, on call neurosurgeon paged; gave verbal orders to maintain seal on drain - Dr. Jeral FruitBotero to address in AM rounds.  Will continue to monitor closely.  Herma ArdMOSELEY, Landis Dowdy F, RN

## 2016-11-14 NOTE — H&P (Signed)
NAME:  Russell Gillespie, Russell        ACCOUNT NO.:  [id number]  MEDICAL RECORD NO.:  [id number]  LOCATION:                                 FACILITY:  PHYSICIAN:  Hilda LiasErnesto Janna Oak, M.D.   DATE OF BIRTH:  1961-10-24  DATE OF ADMISSION:  11/13/2016 DATE OF DISCHARGE:                             HISTORY & PHYSICAL   HISTORY OF PRESENT ILLNESS:  Mr. Russell Gillespie is a gentleman who 2 months ago underwent bilateral L4-L5 laminectomy because of chronic back pain with radiation to both lower extremities.  The patient did well and eventually he went back to work less than a week after surgery.  He had been followed by me in the office.  He had physical therapy.  Several days ago, he developed more back pain and on Friday, he was [facility] by [facility] where MRI was obtained and he was sent to us for further evaluation.  He comes today with his wife and daughter complaining of headache and back pain, pain with radiation to both legs. He denies any stiffness of the neck.  The pain gets better when he lies flat.  PAST MEDICAL HISTORY:  He had a knee replacement, shoulder surgery, lumbar diskectomy about 10 years ago, and laminectomy about 2 months ago.  ALLERGIES:  He is not allergic to medications.  FAMILY HISTORY:  Unremarkable.  MEDICATIONS:  He is taking at the present time cortisone by mouth, oxycodone for pain, and Flexeril.  REVIEW OF SYSTEMS:  Positive for back pain, bilateral leg pain, headache.  The headache is mostly positional.  PHYSICAL EXAMINATION:  GENERAL:  The patient came to my office.  He is not in any acute distress. HEAD, EYES, EARS, NOSE, AND THROAT:  Normal. NECK:  There is no stiffness at all. CARDIOVASCULAR:  Normal. LUNGS:  Clear. ABDOMEN:  Normal. EXTREMITIES:  Scar from previous shoulder surgery as well as knee replacement. NEURO:  He is oriented x3.  Cranial nerves normal.  Strength, I cannot find any weakness.  Reflexes symmetrical.   Sensation normal.  In the lumbar area right on top of the wound, which is well healed, there is a fluid collection.  The MRI show fluid collection going all the way down from the subcutaneous space down to the lumbar spine.  The radiologist called this most likely seroma, but I do believe that clinically he is behaving as spinal fluid leak.  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  RECOMMENDATIONS:  I talked to all 3 __of them__in my office______.  We will approach this in different ways.  One will be __________ to send him to have the fluid drained by the x-ray department, but my feeling is that clinically he is behaving as a spinal fluid leak and at the end we agreed to take him to Surgery.  In Surgery, we will make a decision about what to do.  If we can find the opening, it would be closed and then a decision about using a lumbar drain for at least 48 hours to 72 hours will be made.  He is fully aware that once we do surgery, there is always a possibility that we cannot find the  leak.  Nevertheless, either way the patient is going to be in bedrest with the head down for at least 72 hours.    ______________________________ Hilda LiasErnesto Hieu Herms, M.D.   ______________________________ Hilda LiasErnesto Lalisa Kiehn, M.D.    EB/MEDQ  D:  11/13/2016  T:  11/14/2016  Job:  [id number]

## 2016-11-15 MED ORDER — PROMETHAZINE HCL 25 MG PO TABS: 25.0000 mg | ORAL_TABLET | ORAL | Status: DC | PRN

## 2016-11-15 MED ORDER — HYDROMORPHONE HCL 1 MG/ML IJ SOLN
1.0000 mg | INTRAMUSCULAR | Status: DC | PRN
  Administered 2016-11-15 – 2016-11-18 (×14): 1 mg via INTRAVENOUS
  Administered 2016-11-18: 2 mg via INTRAVENOUS
  Administered 2016-11-18: 1 mg via INTRAVENOUS
  Administered 2016-11-19: 2 mg via INTRAVENOUS
  Filled 2016-11-15: qty 2
  Filled 2016-11-15 (×13): qty 1
  Filled 2016-11-15: qty 2
  Filled 2016-11-15 (×2): qty 1

## 2016-11-15 MED ORDER — PROMETHAZINE HCL 25 MG/ML IJ SOLN
12.5000 mg | Freq: Four times a day (QID) | INTRAMUSCULAR | Status: DC | PRN
  Administered 2016-11-15 (×2): 12.5 mg via INTRAVENOUS
  Filled 2016-11-15 (×2): qty 1

## 2016-11-15 NOTE — Op Note (Signed)
NAME:  Russell Gillespie, Russell Gillespie        ACCOUNT NO.:  [id number]  MEDICAL RECORD NO.:  [id number]  LOCATION:                                 FACILITY:  PHYSICIAN:  Hilda LiasErnesto Armen Waring, M.D.   DATE OF BIRTH:  12/10/1961  DATE OF PROCEDURE:  11/13/2016 DATE OF DISCHARGE:                              OPERATIVE REPORT   PREOPERATIVE DIAGNOSIS:  Lumbar CSF leak with acute radiculopathy.  POSTOPERATIVE DIAGNOSIS:  Lumbar CSF leak with acute radiculopathy.  PROCEDURE:  Exploration of the lumbar wound, repair of durotomy, insertion of lumbar catheter for drain at the level of L3-L4, microscope.  SURGEON:  Dr. Hilda LiasErnesto Jayne Peckenpaugh.  ASSISTANT:  Dr. Lovell SheehanJenkins.  CLINICAL HISTORY:  Russell Gillespie is a gentleman who 2 months ago underwent lumbar laminectomy and decompression.  He did well.  He went to work 6 days after surgery.  Nevertheless, he ended up in the emergency room over the weekend complaining of headache and pain in both legs.  MRI showed fluid collection.  It was believed that was a seroma until I saw him in the office today.  There was no question that clinically he was behaving as a CSF leak.  Surgery was advised.  I spoke with him and his daughter.  PROCEDURE IN DETAIL:  The patient was taken to the OR and after intubation, he was positioned in a prone manner.  The skin was cleaned with DuraPrep and drapes were applied.  Midline incision, resecting the previous scar was made through the skin and subcutaneous tissue. Immediately, CSF came into view.  The dissection was carried out straight down to the epidural space and we brought the microscope. Indeed, dorsolaterally at the level of L4, there was an opening with 2 small nerve roots coming through.  The dissection was carried down and we were able to go laterally to have some more tissue to close the wound.  Using 6-0 Prolene, the wound was closed.  We used 3 stitches in the area.  There was a small opening below, which also was taken  care with a single stitch.  Valsalva maneuver twice was negative.  The area was irrigated.  A surgical sealant was used.  Then, with the help of the fluoroscopy, we localized the area between L3-L4.  Then, a needle was inserted into the epidural space and the catheter was fed into the intradural space.  Nevertheless, we knew that he had some scar tissue and the catheter went down to the lumbar area.  Nevertheless, we decided to leave the catheter in place.  We inspected the wound just to be sure that there was no protrusion in the dura mater.  This was cleared.  Then, the wound was closed with several stitches of Vicryl and the skin with staples and Dermabond.  He is going to remain flat in bed for at least 72 hours.    ______________________________ Hilda LiasErnesto Keelie Zemanek, M.D.   ______________________________ Hilda LiasErnesto Destony Prevost, M.D.    EB/MEDQ  D:  11/13/2016  T:  11/14/2016  Job:  [id number]

## 2016-11-15 NOTE — Progress Notes (Signed)
Neurosurgeon on call paged and made aware of gradually worsening on-going headache as well as nausea with an episode of emesis.  Updated on amount of csf out through the drain. Per MD continue with present care.  Will continue to monitor.

## 2016-11-16 NOTE — Progress Notes (Signed)
Patient ID: Russell Gillespie, male   DOB: 10/20/1961, 55 y.o.   MRN: [id number] Stable, some mild headache. No weakness. Lumbar drain working well. Dc  The drain in am??

## 2016-11-18 LAB — COMPREHENSIVE METABOLIC PANEL
ALBUMIN: 3.5 g/dL (ref 3.5–5.0)
ALT: 54 U/L (ref 17–63)
AST: 36 U/L (ref 15–41)
Alkaline Phosphatase: 60 U/L (ref 38–126)
Anion gap: 8 (ref 5–15)
BILIRUBIN TOTAL: 0.5 mg/dL (ref 0.3–1.2)
BUN: 8 mg/dL (ref 6–20)
CHLORIDE: 100 mmol/L — AB (ref 101–111)
CO2: 29 mmol/L (ref 22–32)
CREATININE: 0.72 mg/dL (ref 0.61–1.24)
Calcium: 9.3 mg/dL (ref 8.9–10.3)
GFR calc Af Amer: >60 mL/min (ref >60)
GFR calc non Af Amer: >60 mL/min (ref >60)
GLUCOSE: 131 mg/dL — AB (ref 65–99)
POTASSIUM: 4.1 mmol/L (ref 3.5–5.1)
Sodium: 137 mmol/L (ref 135–145)
Total Protein: 6.3 g/dL — ABNORMAL LOW (ref 6.5–8.1)

## 2016-11-18 NOTE — Progress Notes (Signed)
Dr. Lovell SheehanJenkins notified of pt having coupled PAC.  CMP ordered.  Will continue to monitor closely.

## 2016-11-18 NOTE — Progress Notes (Signed)
Patient ID: Russell FloorChristopher L Montz, male   DOB: November 03, 1961, 55 y.o.   MRN: [id number] Doing great. Lumbar drain clamped. Hob up tp 45 degrees. Wound dry, no headache.dr Lovell Sheehanjenkins to remove drain in am and possible going home in the afternoon

## 2016-11-18 NOTE — Progress Notes (Signed)
Patient ID: Russell Gillespie, male   DOB: Mar 30, 1961, 55 y.o.   MRN: [id number] Subjective:  The patient is alert and pleasant. He denies headaches. He looks well. His drain became disconnected this morning. He has been at 10 Trendelenburg.  Objective: Vital signs in last 24 hours: Temp:  [97.6 F (36.4 C)-98.2 F (36.8 C)] 97.8 F (36.6 C) (12/09 0800) Pulse Rate:  [30-136] 33 (12/09 0700) Resp:  [10-24] 10 (12/09 0700) BP: (108-140)/(61-94) 131/84 (12/09 0700) SpO2:  [82 %-99 %] 97 % (12/09 0700)  Intake/Output from previous day: 12/08 0701 - 12/09 0700 In: 3220 [P.O.:720; I.V.:2500] Out: 2570 [Urine:2475; Drains:95] Intake/Output this shift: No intake/output data recorded.  Physical exam the patient is alert and pleasant. His dressing is clean and dry.  Lab Results: No results for input(s): WBC, HGB, HCT, PLT in the last 72 hours. BMET No results for input(s): NA, K, CL, CO2, GLUCOSE, BUN, CREATININE, CALCIUM in the last 72 hours.  Studies/Results: No results found.  Assessment/Plan: Postop day #5: We will clamp the drain and raise his head of bed. If he doesn't have any drainage from his wound up plan to remove his lumbar drain tomorrow.  LOS: 5 days     Colsen Modi D 11/18/2016, 9:45 AM

## 2016-11-18 NOTE — Progress Notes (Addendum)
Lumbar drain output 12 mL.  MD notified.  Awaiting return call.  Will continue to monitor.  Russell QualeSean Tajah Noguchi RN  [phone] 11/18/2016  MD Lovell SheehanJenkins returned call; notified of output of 12 mL. Verbal order given to discontinue order to "call MD if lumbar drain output >310mL per hour."  0042 11/18/2016

## 2016-11-19 NOTE — Progress Notes (Signed)
Pt dangled off side of bed and stood at bedside without difficulty or c/o n/v and or headache.  Assisted to chair without difficulty.  Will continue to monitor closely.

## 2016-11-19 NOTE — Discharge Summary (Signed)
Physician Discharge Summary  Patient ID: Russell Gillespie MRN: [id number] DOB/AGE: 06/18/1961 55 y.o.  Admit date: 11/13/2016 Discharge date: 11/19/2016  Admission Diagnoses:CSF fistula, pseudomeningocele  Discharge Diagnoses: The same Active Problems:   Lumbar radiculopathy   Discharged Condition: good  Hospital Course: Dr. Jeral FruitBotero took the patient back to the OR on 11/14/2016 for repair of a CSF fistula/pseudomeningocele and placement of a lumbar drain.  The patient's postoperative course was unremarkable. He was kept at bed rest for several days. On 11/18/2016 history was clamped and his head of bed was elevated. He hasn't had any drainage or headaches. He requested discharge to home. He was given written and oral discharge instructions. All his questions were answered.  The plan is to mobilize him. If he doesn't have any drainage from his lumbar drain exit site or significant headaches we will discharge him later on this afternoon  Consults: None Significant Diagnostic Studies: None Treatments: Repair of pseudomeningocele/CSF fistula Discharge Exam: Blood pressure (!) 144/94, pulse 81, temperature 97.7 F (36.5 C), temperature source Oral, resp. rate 16, height 6\' 1"  (1.854 m), weight 97 kg (213 lb 13.5 oz), SpO2 96 %. Patient is alert and pleasant. His wound is healing well. There is no drainage. The lumbar drain has been removed.  Disposition: Home     Medication List    STOP taking these medications   acetaminophen 500 MG tablet Commonly known as:  TYLENOL   methocarbamol 500 MG tablet Commonly known as:  ROBAXIN   methylPREDNISolone 4 MG Tbpk tablet Commonly known as:  MEDROL DOSEPAK   sulfamethoxazole-trimethoprim 400-80 MG tablet Commonly known as:  BACTRIM,SEPTRA     TAKE these medications   aspirin EC 81 MG tablet Take 81 mg by mouth daily.   cyclobenzaprine 5 MG tablet Commonly known as:  FLEXERIL Take 5 mg by mouth 3 (three) times daily as  needed for muscle spasms.   fluticasone 50 MCG/ACT nasal spray Commonly known as:  FLONASE Place 1 spray into both nostrils daily as needed (seasonal allergies).   gabapentin 300 MG capsule Commonly known as:  NEURONTIN Take 300-600 mg by mouth daily as needed (spinal fluid headaches).   lisdexamfetamine 60 MG capsule Commonly known as:  VYVANSE Take 60 mg by mouth daily.   oxyCODONE 5 MG immediate release tablet Commonly known as:  Oxy IR/ROXICODONE Take 1-2 tablets (5-10 mg total) by mouth every 3 (three) hours as needed for moderate pain or severe pain.   oxyCODONE 15 MG immediate release tablet Commonly known as:  ROXICODONE Take 7.5 mg by mouth every 6 (six) hours as needed for pain.        SignedTressie Stalker: [name] 11/19/2016, 9:11 AM

## 2018-09-04 ENCOUNTER — Other Ambulatory Visit (HOSPITAL_COMMUNITY): Payer: Self-pay | Admitting: Surgical

## 2018-09-04 DIAGNOSIS — Z96652 Presence of left artificial knee joint: Secondary | ICD-10-CM

## 2018-09-18 ENCOUNTER — Encounter (HOSPITAL_COMMUNITY)
Admission: RE | Admit: 2018-09-18 | Discharge: 2018-09-18 | Disposition: A | Discharge status: Home / Self Care | Payer: 59 | Source: Ambulatory Visit | Attending: Surgical | Admitting: Surgical

## 2018-09-18 DIAGNOSIS — Z96652 Presence of left artificial knee joint: Secondary | ICD-10-CM | POA: Diagnosis not present

## 2018-09-18 MED ORDER — TECHNETIUM TC 99M MEDRONATE IV KIT
21.7000 | PACK | Freq: Once | INTRAVENOUS | Status: AC | PRN
  Administered 2018-09-18: 21.7 via INTRAVENOUS

## 2018-10-11 ENCOUNTER — Telehealth: Payer: Self-pay

## 2018-10-11 NOTE — Telephone Encounter (Signed)
   [facility] Pre-operative Risk Assessment    Request for surgical clearance:  1. What type of surgery is being performed? left knee arthroplasty revision polyethylene vs total knee  2. When is this surgery scheduled? 10-23-2018   3. What type of clearance is required (medical clearance vs. Pharmacy clearance to hold med vs. Both)? MEDICAL  4. Are there any medications that need to be held prior to surgery and how long? NONE LISTED   5. Practice name and name of physician performing surgery?   [facility] ATTN:KELLY  6. What is your office phone number [phone]    7.   What is your office fax number  [phone]  8.   Anesthesia type (None, local, MAC, general) ? CHOICE   Waylan Rocher 10/11/2018, 7:39 AM  _________________________________________________________________   (provider comments below)

## 2018-10-14 NOTE — Patient Instructions (Signed)
GAR GLANCE  10/14/2018   Your procedure is scheduled on: 10-23-18   Report to [facility] Main  Entrance    Report to admitting at 3:00PM    Call this number if you have problems the morning of surgery [phone]     Remember: Do not eat food After Midnight. YOU MAY HAVE CLEAR LIQUIDS FROM MIDNIGHT UNTIL 11:30AM. NOTHING BY MOUTH AFTER 11:30AM! BRUSH YOUR TEETH MORNING OF SURGERY AND RINSE YOUR MOUTH OUT, NO CHEWING GUM CANDY OR MINTS.      CLEAR LIQUID DIET   Foods Allowed                                                                     Foods Excluded  Coffee and tea, regular and decaf                             liquids that you cannot  Plain Jell-O in any flavor                                             see through such as: Fruit ices (not with fruit pulp)                                     milk, soups, orange juice  Iced Popsicles                                    All solid food Carbonated beverages, regular and diet                                    Cranberry, grape and apple juices Sports drinks like Gatorade Lightly seasoned clear broth or consume(fat free) Sugar, honey syrup  Sample Menu Breakfast                                Lunch                                     Supper Cranberry juice                    Beef broth                            Chicken broth Jell-O                                     Grape juice  Apple juice Coffee or tea                        Jell-O                                      Popsicle                                                Coffee or tea                        Coffee or tea  _____________________________________________________________________       Take these medicines the morning of surgery with A SIP OF WATER: rosuvastatin, nasal spray if needed                                 You may not have any metal on your body including hair pins and      piercings  Do not wear jewelry, make-up, lotions, powders or perfumes, deodorant                         Men may shave face and neck.   Do not bring valuables to the hospital. [place] IS NOT             RESPONSIBLE   FOR VALUABLES.  Contacts, dentures or bridgework may not be worn into surgery.  Leave suitcase in the car. After surgery it may be brought to your room.                  Please read over the following fact sheets you were given: _____________________________________________________________________             [facility] - Preparing for Surgery Before surgery, you can play an important role.  Because skin is not sterile, your skin needs to be as free of germs as possible.  You can reduce the number of germs on your skin by washing with CHG (chlorahexidine gluconate) soap before surgery.  CHG is an antiseptic cleaner which kills germs and bonds with the skin to continue killing germs even after washing. Please DO NOT use if you have an allergy to CHG or antibacterial soaps.  If your skin becomes reddened/irritated stop using the CHG and inform your nurse when you arrive at Short Stay. Do not shave (including legs and underarms) for at least 48 hours prior to the first CHG shower.  You may shave your face/neck. Please follow these instructions carefully:  1.  Shower with CHG Soap the night before surgery and the  morning of Surgery.  2.  If you choose to wash your hair, wash your hair first as usual with your  normal  shampoo.  3.  After you shampoo, rinse your hair and body thoroughly to remove the  shampoo.                           4.  Use CHG as you would any other liquid soap.  You can apply chg directly  to the skin and wash  Gently with a scrungie or clean washcloth.  5.  Apply the CHG Soap to your body ONLY FROM THE NECK DOWN.   Do not use on face/ open                           Wound or open sores. Avoid contact with eyes, ears mouth and  genitals (private parts).                       Wash face,  Genitals (private parts) with your normal soap.             6.  Wash thoroughly, paying special attention to the area where your surgery  will be performed.  7.  Thoroughly rinse your body with warm water from the neck down.  8.  DO NOT shower/wash with your normal soap after using and rinsing off  the CHG Soap.                9.  Pat yourself dry with a clean towel.            10.  Wear clean pajamas.            11.  Place clean sheets on your bed the night of your first shower and do not  sleep with pets. Day of Surgery : Do not apply any lotions/deodorants the morning of surgery.  Please wear clean clothes to the hospital/surgery center.  FAILURE TO FOLLOW THESE INSTRUCTIONS MAY RESULT IN THE CANCELLATION OF YOUR SURGERY PATIENT SIGNATURE_________________________________  NURSE SIGNATURE__________________________________  ________________________________________________________________________   [name]  An incentive spirometer is a tool that can help keep your lungs clear and active. This tool measures how well you are filling your lungs with each breath. Taking long deep breaths may help reverse or decrease the chance of developing breathing (pulmonary) problems (especially infection) following:  A long period of time when you are unable to move or be active. BEFORE THE PROCEDURE   If the spirometer includes an indicator to show your best effort, your nurse or respiratory therapist will set it to a desired goal.  If possible, sit up straight or lean slightly forward. Try not to slouch.  Hold the incentive spirometer in an upright position. INSTRUCTIONS FOR USE  1. Sit on the edge of your bed if possible, or sit up as far as you can in bed or on a chair. 2. Hold the incentive spirometer in an upright position. 3. Breathe out normally. 4. Place the mouthpiece in your mouth and seal your lips tightly around  it. 5. Breathe in slowly and as deeply as possible, raising the piston or the ball toward the top of the column. 6. Hold your breath for 3-5 seconds or for as long as possible. Allow the piston or ball to fall to the bottom of the column. 7. Remove the mouthpiece from your mouth and breathe out normally. 8. Rest for a few seconds and repeat Steps 1 through 7 at least 10 times every 1-2 hours when you are awake. Take your time and take a few normal breaths between deep breaths. 9. The spirometer may include an indicator to show your best effort. Use the indicator as a goal to work toward during each repetition. 10. After each set of 10 deep breaths, practice coughing to be sure your lungs are clear. If you have an incision (the cut made at the time of  surgery), support your incision when coughing by placing a pillow or rolled up towels firmly against it. Once you are able to get out of bed, walk around indoors and cough well. You may stop using the incentive spirometer when instructed by your caregiver.  RISKS AND COMPLICATIONS  Take your time so you do not get dizzy or light-headed.  If you are in pain, you may need to take or ask for pain medication before doing incentive spirometry. It is harder to take a deep breath if you are having pain. AFTER USE  Rest and breathe slowly and easily.  It can be helpful to keep track of a log of your progress. Your caregiver can provide you with a simple table to help with this. If you are using the spirometer at home, follow these instructions: [place] IF:   You are having difficultly using the spirometer.  You have trouble using the spirometer as often as instructed.  Your pain medication is not giving enough relief while using the spirometer.  You develop fever of 100.5 F (38.1 C) or higher. SEEK IMMEDIATE MEDICAL CARE IF:   You cough up bloody sputum that had not been present before.  You develop fever of 102 F (38.9 C) or  greater.  You develop worsening pain at or near the incision site. MAKE SURE YOU:   Understand these instructions.  Will watch your condition.  Will get help right away if you are not doing well or get worse. Document Released: 04/09/2007 Document Revised: 02/19/2012 Document Reviewed: 06/10/2007 Exit[employer] Patient Information 2014 Exit[employer], [place].   ________________________________________________________________________  WHAT IS A BLOOD TRANSFUSION? Blood Transfusion Information  A transfusion is the replacement of blood or some of its parts. Blood is made up of multiple cells which provide different functions.  Red blood cells carry oxygen and are used for blood loss replacement.  White blood cells fight against infection.  Platelets control bleeding.  Plasma helps clot blood.  Other blood products are available for specialized needs, such as hemophilia or other clotting disorders. BEFORE THE TRANSFUSION  Who gives blood for transfusions?   Healthy volunteers who are fully evaluated to make sure their blood is safe. This is blood bank blood. Transfusion therapy is the safest it has ever been in the practice of medicine. Before blood is taken from a donor, a complete history is taken to make sure that person has no history of diseases nor engages in risky social behavior (examples are intravenous drug use or sexual activity with multiple partners). The donor's travel history is screened to minimize risk of transmitting infections, such as malaria. The donated blood is tested for signs of infectious diseases, such as HIV and hepatitis. The blood is then tested to be sure it is compatible with you in order to minimize the chance of a transfusion reaction. If you or a relative donates blood, this is often done in anticipation of surgery and is not appropriate for emergency situations. It takes many days to process the donated blood. RISKS AND COMPLICATIONS Although transfusion therapy  is very safe and saves many lives, the main dangers of transfusion include:   Getting an infectious disease.  Developing a transfusion reaction. This is an allergic reaction to something in the blood you were given. Every precaution is taken to prevent this. The decision to have a blood transfusion has been considered carefully by your caregiver before blood is given. Blood is not given unless the benefits outweigh the risks. AFTER THE  TRANSFUSION  Right after receiving a blood transfusion, you will usually feel much better and more energetic. This is especially true if your red blood cells have gotten low (anemic). The transfusion raises the level of the red blood cells which carry oxygen, and this usually causes an energy increase.  The nurse administering the transfusion will monitor you carefully for complications. HOME CARE INSTRUCTIONS  No special instructions are needed after a transfusion. You may find your energy is better. Speak with your caregiver about any limitations on activity for underlying diseases you may have. SEEK MEDICAL CARE IF:   Your condition is not improving after your transfusion.  You develop redness or irritation at the intravenous (IV) site. SEEK IMMEDIATE MEDICAL CARE IF:  Any of the following symptoms occur over the next 12 hours:  Shaking chills.  You have a temperature by mouth above 102 F (38.9 C), not controlled by medicine.  Chest, back, or muscle pain.  People around you feel you are not acting correctly or are confused.  Shortness of breath or difficulty breathing.  Dizziness and fainting.  You get a rash or develop hives.  You have a decrease in urine output.  Your urine turns a dark color or changes to pink, red, or brown. Any of the following symptoms occur over the next 10 days:  You have a temperature by mouth above 102 F (38.9 C), not controlled by medicine.  Shortness of breath.  Weakness after normal activity.  The white  part of the eye turns yellow (jaundice).  You have a decrease in the amount of urine or are urinating less often.  Your urine turns a dark color or changes to pink, red, or brown. Document Released: 11/24/2000 Document Revised: 02/19/2012 Document Reviewed: 07/13/2008 [facility] Patient Information 2014 [place], [place].  _______________________________________________________________________

## 2018-10-14 NOTE — Telephone Encounter (Signed)
This patient is not followed and is our practice and per phone conversation he has no cardiac history. He says that he was cleared for surgery by his PCP.  If you think he needs cardiac clearance please place a referral with the reason.   I will route this to the requesting provider and remove from our preop pool.   Thank you, Lizabeth Leyden, NP

## 2018-10-14 NOTE — Telephone Encounter (Signed)
I don't see any record that we have seen this pt. Can you call him to ask if he is followed for cardiology or has ever been seen for cardiology (?another practice or in the hospital). If not please let the requesting office know that he is not followed. Are they referring the patient for a reason to be seen?

## 2018-10-14 NOTE — Telephone Encounter (Signed)
Attempted to reach pt but there was no answer. Left message for pt to call back

## 2018-10-17 ENCOUNTER — Other Ambulatory Visit: Payer: Self-pay

## 2018-10-17 ENCOUNTER — Encounter (HOSPITAL_COMMUNITY): Payer: Self-pay

## 2018-10-17 ENCOUNTER — Encounter (HOSPITAL_COMMUNITY)
Admission: RE | Admit: 2018-10-17 | Discharge: 2018-10-17 | Disposition: A | Discharge status: Home / Self Care | Payer: 59 | Source: Ambulatory Visit | Attending: Orthopedic Surgery | Admitting: Orthopedic Surgery

## 2018-10-17 DIAGNOSIS — Z01812 Encounter for preprocedural laboratory examination: Secondary | ICD-10-CM | POA: Diagnosis not present

## 2018-10-17 DIAGNOSIS — Z96652 Presence of left artificial knee joint: Secondary | ICD-10-CM | POA: Insufficient documentation

## 2018-10-17 DIAGNOSIS — T8489XA Other specified complication of internal orthopedic prosthetic devices, implants and grafts, initial encounter: Secondary | ICD-10-CM | POA: Diagnosis not present

## 2018-10-17 DIAGNOSIS — Y838 Other surgical procedures as the cause of abnormal reaction of the patient, or of later complication, without mention of misadventure at the time of the procedure: Secondary | ICD-10-CM | POA: Diagnosis not present

## 2018-10-17 HISTORY — DX: Unspecified amblyopia, left eye: H53.002

## 2018-10-17 LAB — CBC
HCT: 40.7 % (ref 39.0–52.0)
Hemoglobin: 13.7 g/dL (ref 13.0–17.0)
MCH: 31.2 pg (ref 26.0–34.0)
MCHC: 33.7 g/dL (ref 30.0–36.0)
MCV: 92.7 fL (ref 80.0–100.0)
NRBC: 0 % (ref 0.0–0.2)
PLATELETS: 238 10*3/uL (ref 150–400)
RBC: 4.39 MIL/uL (ref 4.22–5.81)
RDW: 12.3 % (ref 11.5–15.5)
WBC: 4.9 10*3/uL (ref 4.0–10.5)

## 2018-10-17 LAB — COMPREHENSIVE METABOLIC PANEL
ALT: 26 U/L (ref 0–44)
ANION GAP: 7 (ref 5–15)
AST: 24 U/L (ref 15–41)
Albumin: 4.2 g/dL (ref 3.5–5.0)
Alkaline Phosphatase: 54 U/L (ref 38–126)
BUN: 12 mg/dL (ref 6–20)
CO2: 29 mmol/L (ref 22–32)
Calcium: 9.1 mg/dL (ref 8.9–10.3)
Chloride: 104 mmol/L (ref 98–111)
Creatinine, Ser: 0.83 mg/dL (ref 0.61–1.24)
GFR calc Af Amer: >60 mL/min (ref >60)
GFR calc non Af Amer: >60 mL/min (ref >60)
GLUCOSE: 145 mg/dL — AB (ref 70–99)
Potassium: 3.9 mmol/L (ref 3.5–5.1)
SODIUM: 140 mmol/L (ref 135–145)
Total Bilirubin: 0.8 mg/dL (ref 0.3–1.2)
Total Protein: 6.8 g/dL (ref 6.5–8.1)

## 2018-10-17 LAB — PROTIME-INR
INR: 0.99
Prothrombin Time: 13 seconds (ref 11.4–15.2)

## 2018-10-17 LAB — SURGICAL PCR SCREEN
MRSA, PCR: NEGATIVE
Staphylococcus aureus: NEGATIVE

## 2018-10-17 LAB — APTT: APTT: 27 s (ref 24–36)

## 2018-10-17 NOTE — Progress Notes (Signed)
Clearance Dr Tally Joe 10-11-18 on chart   EKG 10-11-18 on chart  From [facility]   LOV Dr Tally Joe 10-11-18 on chart

## 2018-10-22 MED ORDER — TRANEXAMIC ACID 1000 MG/10ML IV SOLN
2000.0000 mg | INTRAVENOUS | Status: DC
  Filled 2018-10-22: qty 20

## 2018-10-22 MED ORDER — BUPIVACAINE LIPOSOME 1.3 % IJ SUSP
20.0000 mL | INTRAMUSCULAR | Status: DC
  Filled 2018-10-22: qty 20

## 2018-10-22 NOTE — Anesthesia Preprocedure Evaluation (Addendum)
Anesthesia Evaluation  Patient identified by MRN, date of birth, ID band Patient awake    Reviewed: Allergy & Precautions, H&P , NPO status , Patient's Chart, lab work & pertinent test results  Airway Mallampati: II  TM Distance: >3 FB Neck ROM: Full    Dental no notable dental hx. (+) Teeth Intact, Dental Advisory Given   Pulmonary former smoker,    Pulmonary exam normal breath sounds clear to auscultation       Cardiovascular Normal cardiovascular exam+ dysrhythmias Atrial Fibrillation  Rhythm:Regular Rate:Normal     Neuro/Psych  Neuromuscular disease negative psych ROS   GI/Hepatic negative GI ROS, Neg liver ROS,   Endo/Other  negative endocrine ROS  Renal/GU Renal disease     Musculoskeletal  (+) Arthritis , S/P L4-S1  discetomy   Abdominal   Peds  Hematology negative hematology ROS (+)   Anesthesia Other Findings   Reproductive/Obstetrics                            Lab Results  Component Value Date   CREATININE 0.83 10/17/2018   BUN 12 10/17/2018   NA 140 10/17/2018   K 3.9 10/17/2018   CL 104 10/17/2018   CO2 29 10/17/2018    Lab Results  Component Value Date   WBC 4.9 10/17/2018   HGB 13.7 10/17/2018   HCT 40.7 10/17/2018   MCV 92.7 10/17/2018   PLT 238 10/17/2018    Anesthesia Physical Anesthesia Plan  ASA: III  Anesthesia Plan: Spinal   Post-op Pain Management:  [facility] for Post-op pain   Induction:   PONV Risk Score and Plan: 1 and Treatment may vary due to age or medical condition, Ondansetron and Dexamethasone  Airway Management Planned: Natural Airway  Additional Equipment:   Intra-op Plan:   Post-operative Plan:   Informed Consent: I have reviewed the patients History and Physical, chart, labs and discussed the procedure including the risks, benefits and alternatives for the proposed anesthesia with the patient or authorized representative who has  indicated his/her understanding and acceptance.   Dental advisory given  Plan Discussed with:   Anesthesia Plan Comments: (+ adductor canal , GA back up)        Anesthesia Quick Evaluation

## 2018-10-22 NOTE — H&P (Addendum)
TOTAL KNEE REVISION ADMISSION H&P  Patient is being admitted for left revision total knee arthroplasty vs polyethylene exchange.  Subjective:  Chief Complaint:left knee pain.  HPI: Russell Gillespie, 57 y.o. male, has a history of pain and functional disability in the left knee(s) due to failed previous arthroplasty and patient has failed non-surgical conservative treatments for greater than 12 weeks to include NSAID's and/or analgesics, flexibility and strengthening excercises and activity modification. The indications for the revision of the total knee arthroplasty are bearing surface wear leading to symptomatic synovitis and tibiofemoral instability. Onset of symptoms was gradual starting 1 year ago with gradually worsening course since that time.  Prior procedures on the left knee(s) include arthroplasty.  Patient currently rates pain in the left knee(s) at 3 out of 10 with activity. There is worsening of pain with activity and weight bearing, pain that interferes with activities of daily living and joint swelling.  Patient has evidence of no evidence of prosthetic loosening or periprosthetic fracture by imaging studies. This condition presents safety issues increasing the risk of falls. There is no current active infection.  Patient Active Problem List   Diagnosis Date Noted  . Lumbar radiculopathy 11/13/2016  . Lumbar stenosis 09/15/2016  . OA (osteoarthritis) of hip 05/03/2016  . Internal thrombosed hemorrhoid-left lateral 05/23/2012   Past Medical History:  Diagnosis Date  . Amblyopia of eye, left    reports it is "incorrectable"   . Chronic kidney disease    kidney stones  . Dysrhythmia    occasional irregular HR  . Family history of adverse reaction to anesthesia    Mother gets PONV  . Hemorrhoids   . Internal thrombosed hemorrhoid-left lateral 05/23/2012  . Irregular heart beat    reports " they told me i had a heart murmur since i was a kid in school" reports as  nonsymptomatic   . Radiculopathy of lumbar region   . Tinnitus    bilateral   . Tuberculosis    tests positively on TB skin tests    Past Surgical History:  Procedure Laterality Date  . BACK SURGERY  2006  . COLONOSCOPY    . LUMBAR LAMINECTOMY/DECOMPRESSION MICRODISCECTOMY Bilateral 09/15/2016   Procedure: Bilateral Lumbar four - Lumber Five, Lumbar Five - Sacral One Laminectomy/Foraminotomy;  Surgeon: Hilda Lias, MD;  Location: [facility] OR;  Service: Neurosurgery;  Laterality: Bilateral;  Bilateral Lumbar four - Lumber Five, Lumbar Five - Sacral One Laminectomy/Foraminotomy  . LUMBAR WOUND DEBRIDEMENT N/A 11/13/2016   Procedure: EXPLORATION OF LUMBAR WOUND AND REPAIR OF CSF LEAK;  Surgeon: Hilda Lias, MD;  Location: [facility] OR;  Service: Neurosurgery;  Laterality: N/A;  EXPLORATION OF LUMBAR WOUND  . ROTATOR CUFF REPAIR Left 2013   and bone spurs   . TONSILLECTOMY    . TOTAL HIP ARTHROPLASTY Right 05/03/2016   Procedure: RIGHT TOTAL HIP ARTHROPLASTY ANTERIOR APPROACH;  Surgeon: Ollen Gross, MD;  Location: [facility] ORS;  Service: Orthopedics;  Laterality: Right;  . TOTAL KNEE ARTHROPLASTY  01/2009   left    Current Facility-Administered Medications  Medication Dose Route Frequency Provider Last Rate Last Dose  . [START ON 10/23/2018] bupivacaine liposome (EXPAREL) 1.3 % injection 266 mg  20 mL Other On Call to OR Ollen Gross, MD      . Melene Muller ON 10/23/2018] tranexamic acid (CYKLOKAPRON) 2,000 mg in sodium chloride 0.9 % 50 mL Topical Application  2,000 mg Topical On Call to OR Ollen Gross, MD       Current Outpatient Medications  Medication Sig Dispense Refill Last Dose  . fluticasone (FLONASE) 50 MCG/ACT nasal spray Place 1 spray into both nostrils daily.    year ago  . HYDROcodone-acetaminophen (NORCO/VICODIN) 5-325 MG tablet Take 1 tablet by mouth every 6 (six) hours as needed for moderate pain.     Marland Kitchen ibuprofen (ADVIL,MOTRIN) 200 MG tablet Take 400-800 mg by mouth every 6 (six) hours  as needed for moderate pain.     Marland Kitchen lisdexamfetamine (VYVANSE) 70 MG capsule Take 70 mg by mouth daily.    11/10/2016  . Omega-3 Fatty Acids (FISH OIL) 1000 MG CAPS Take 2,000 mg by mouth daily.     . rosuvastatin (CRESTOR) 5 MG tablet Take 5 mg by mouth daily.     Marland Kitchen oxyCODONE (OXY IR/ROXICODONE) 5 MG immediate release tablet Take 1-2 tablets (5-10 mg total) by mouth every 3 (three) hours as needed for moderate pain or severe pain. (Patient not taking: Reported on 11/13/2016) 90 tablet 0 Not Taking at Unknown time   Allergies  Allergen Reactions  . Percocet [Oxycodone-Acetaminophen] Itching    Social History   Tobacco Use  . Smoking status: Former Smoker    Types: Cigarettes  . Smokeless tobacco: Former Neurosurgeon    Quit date: 04/25/1997  Substance Use Topics  . Alcohol use: Yes    Comment: occasionally    Family History  Problem Relation Age of Onset  . Diabetes Mother   . Hypertension Mother   . Dementia Father       Review of Systems  Constitutional: Negative.   HENT: Negative.   Eyes: Negative.   Respiratory: Negative.   Cardiovascular: Negative.   Gastrointestinal: Negative.   Genitourinary: Negative.   Musculoskeletal: Positive for joint pain and myalgias. Negative for back pain, falls and neck pain.  Skin: Negative.   Neurological: Negative.   Endo/Heme/Allergies: Negative.   Psychiatric/Behavioral: Negative.      Objective:  Physical Exam  Constitutional: He is oriented to person, place, and time. He appears well-developed. No distress.  Obese  HENT:  Head: Normocephalic and atraumatic.  Right Ear: External ear normal.  Left Ear: External ear normal.  Nose: Nose normal.  Mouth/Throat: Oropharynx is clear and moist.  Eyes: Conjunctivae and EOM are normal.  Neck: Normal range of motion. Neck supple.  Cardiovascular: Normal rate, regular rhythm, normal heart sounds and intact distal pulses.  No murmur heard. Respiratory: Effort normal and breath sounds normal. No  respiratory distress. He has no wheezes.  GI: Soft. Bowel sounds are normal. He exhibits no distension. There is no tenderness.  Musculoskeletal:       Right hip: Normal.       Left hip: Normal.       Right knee: Normal.  Left Knee Exam: Recurrent effusion. No warmth. Range of motion is 2 degrees of hyper-extension to 120 degrees of flexion. No medial joint line tenderness. No lateral joint line tenderness. Small amount of varus-valgus laxity. Moderate amount of AP laxity.  Neurological: He is alert and oriented to person, place, and time. He has normal strength. No sensory deficit.  Skin: No rash noted. He is not diaphoretic. No erythema.  Psychiatric: He has a normal mood and affect. His behavior is normal.    Ht: 6 ft 1 in  Wt: 244.6 lbs  BMI: 32.3  BP: 118/76 sitting R arm  Pulse: 72 bpm  Pain Scale: 3   Imaging Review Plain radiographs demonstrate severe degenerative joint disease of the left knee(s). The overall alignment is  neutral.There is evidence of no loosening of the femoral, tibial and patellar components. The bone quality appears to be good for age and reported activity level.    Preoperative templating of the joint replacement has been completed, documented, and submitted to the Operating Room personnel in order to optimize intra-operative equipment management.   Assessment/Plan:  End stage primary osteoarthritis, left knee(s) with failed previous arthroplasty.   The patient history, physical examination, clinical judgment of the provider and imaging studies are consistent with end stage degenerative joint disease of the left knee(s), previous total knee arthroplasty. Revision total knee arthroplasty is deemed medically necessary. The treatment options including medical management, injection therapy, arthroscopy and revision arthroplasty were discussed at length. The risks and benefits of revision total knee arthroplasty were presented and reviewed. The risks due to  aseptic loosening, infection, stiffness, patella tracking problems, thromboembolic complications and other imponderables were discussed. The patient acknowledged the explanation, agreed to proceed with the plan and consent was signed. Patient is being admitted for inpatient treatment for surgery, pain control, PT, OT, prophylactic antibiotics, VTE prophylaxis, progressive ambulation and ADL's and discharge planning.The patient is planning to be discharged home.   Therapy Plans: outpatient therapy vs HEP Disposition: Home with wife DME needed: none PCP: Dr. Tally Joe Other: no anesthesia concerns  Dimitri Ped, PA-C

## 2018-10-23 ENCOUNTER — Encounter (HOSPITAL_COMMUNITY): Payer: Self-pay | Admitting: General Practice

## 2018-10-23 ENCOUNTER — Inpatient Hospital Stay (HOSPITAL_COMMUNITY)
Admission: RE | Admit: 2018-10-23 | Discharge: 2018-10-24 | DRG: 465 | Disposition: A | Discharge status: Home / Self Care | Payer: 59 | Attending: Orthopedic Surgery | Admitting: Orthopedic Surgery

## 2018-10-23 ENCOUNTER — Inpatient Hospital Stay (HOSPITAL_COMMUNITY): Payer: 59 | Admitting: Anesthesiology

## 2018-10-23 ENCOUNTER — Encounter (HOSPITAL_COMMUNITY)
Admission: RE | Disposition: A | Discharge status: Home / Self Care | Payer: Self-pay | Source: Home / Self Care | Attending: Orthopedic Surgery

## 2018-10-23 DIAGNOSIS — Z79899 Other long term (current) drug therapy: Secondary | ICD-10-CM | POA: Diagnosis not present

## 2018-10-23 DIAGNOSIS — H53009 Unspecified amblyopia, unspecified eye: Secondary | ICD-10-CM | POA: Diagnosis present

## 2018-10-23 DIAGNOSIS — M25462 Effusion, left knee: Secondary | ICD-10-CM | POA: Diagnosis present

## 2018-10-23 DIAGNOSIS — Z7951 Long term (current) use of inhaled steroids: Secondary | ICD-10-CM

## 2018-10-23 DIAGNOSIS — T84023A Instability of internal left knee prosthesis, initial encounter: Secondary | ICD-10-CM | POA: Diagnosis present

## 2018-10-23 DIAGNOSIS — Z96659 Presence of unspecified artificial knee joint: Secondary | ICD-10-CM

## 2018-10-23 DIAGNOSIS — Z87891 Personal history of nicotine dependence: Secondary | ICD-10-CM

## 2018-10-23 DIAGNOSIS — Z885 Allergy status to narcotic agent status: Secondary | ICD-10-CM

## 2018-10-23 DIAGNOSIS — T84018A Broken internal joint prosthesis, other site, initial encounter: Secondary | ICD-10-CM

## 2018-10-23 DIAGNOSIS — H9313 Tinnitus, bilateral: Secondary | ICD-10-CM | POA: Diagnosis present

## 2018-10-23 DIAGNOSIS — Z87442 Personal history of urinary calculi: Secondary | ICD-10-CM | POA: Diagnosis not present

## 2018-10-23 DIAGNOSIS — M25562 Pain in left knee: Secondary | ICD-10-CM | POA: Diagnosis present

## 2018-10-23 DIAGNOSIS — M1712 Unilateral primary osteoarthritis, left knee: Secondary | ICD-10-CM | POA: Diagnosis present

## 2018-10-23 DIAGNOSIS — Y792 Prosthetic and other implants, materials and accessory orthopedic devices associated with adverse incidents: Secondary | ICD-10-CM | POA: Diagnosis present

## 2018-10-23 HISTORY — PX: TOTAL KNEE REVISION: SHX996

## 2018-10-23 LAB — TYPE AND SCREEN
ABO/RH(D): A POS
Antibody Screen: NEGATIVE

## 2018-10-23 SURGERY — TOTAL KNEE REVISION
Anesthesia: Spinal | Laterality: Left

## 2018-10-23 MED ORDER — DOCUSATE SODIUM 100 MG PO CAPS
100.0000 mg | ORAL_CAPSULE | Freq: Two times a day (BID) | ORAL | Status: DC
  Administered 2018-10-23 – 2018-10-24 (×2): 100 mg via ORAL
  Filled 2018-10-23 (×2): qty 1

## 2018-10-23 MED ORDER — HYDROCODONE-ACETAMINOPHEN 7.5-325 MG PO TABS: 1.0000 | ORAL_TABLET | Freq: Once | ORAL | Status: DC | PRN

## 2018-10-23 MED ORDER — PROPOFOL 10 MG/ML IV BOLUS
INTRAVENOUS | Status: AC
  Filled 2018-10-23: qty 20

## 2018-10-23 MED ORDER — DIPHENHYDRAMINE HCL 12.5 MG/5ML PO ELIX: 12.5000 mg | ORAL_SOLUTION | ORAL | Status: DC | PRN

## 2018-10-23 MED ORDER — METOCLOPRAMIDE HCL 5 MG/ML IJ SOLN: 5.0000 mg | Freq: Three times a day (TID) | INTRAMUSCULAR | Status: DC | PRN

## 2018-10-23 MED ORDER — FLEET ENEMA 7-19 GM/118ML RE ENEM: 1.0000 | ENEMA | Freq: Once | RECTAL | Status: DC | PRN

## 2018-10-23 MED ORDER — ONDANSETRON HCL 4 MG/2ML IJ SOLN
INTRAMUSCULAR | Status: DC | PRN
  Administered 2018-10-23: 4 mg via INTRAVENOUS

## 2018-10-23 MED ORDER — METOCLOPRAMIDE HCL 5 MG PO TABS: 5.0000 mg | ORAL_TABLET | Freq: Three times a day (TID) | ORAL | Status: DC | PRN

## 2018-10-23 MED ORDER — SODIUM CHLORIDE 0.9 % IV SOLN
INTRAVENOUS | Status: DC
  Administered 2018-10-23 – 2018-10-24 (×2): via INTRAVENOUS

## 2018-10-23 MED ORDER — FENTANYL CITRATE (PF) 100 MCG/2ML IJ SOLN
50.0000 ug | Freq: Once | INTRAMUSCULAR | Status: AC
  Administered 2018-10-23: 100 ug via INTRAVENOUS
  Filled 2018-10-23: qty 2

## 2018-10-23 MED ORDER — CEFAZOLIN SODIUM-DEXTROSE 2-4 GM/100ML-% IV SOLN
2.0000 g | INTRAVENOUS | Status: AC
  Administered 2018-10-23: 2 g via INTRAVENOUS
  Filled 2018-10-23: qty 100

## 2018-10-23 MED ORDER — STERILE WATER FOR IRRIGATION IR SOLN
Status: DC | PRN
  Administered 2018-10-23: 1500 mL

## 2018-10-23 MED ORDER — ACETAMINOPHEN 325 MG PO TABS: 325.0000 mg | ORAL_TABLET | Freq: Four times a day (QID) | ORAL | Status: DC | PRN

## 2018-10-23 MED ORDER — MIDAZOLAM HCL 5 MG/5ML IJ SOLN
INTRAMUSCULAR | Status: DC | PRN
  Administered 2018-10-23: 2 mg via INTRAVENOUS

## 2018-10-23 MED ORDER — DEXAMETHASONE SODIUM PHOSPHATE 10 MG/ML IJ SOLN
8.0000 mg | Freq: Once | INTRAMUSCULAR | Status: AC
  Administered 2018-10-23: 8 mg via INTRAVENOUS

## 2018-10-23 MED ORDER — HYDROCODONE-ACETAMINOPHEN 7.5-325 MG PO TABS
1.0000 | ORAL_TABLET | ORAL | Status: DC | PRN
  Administered 2018-10-24 (×3): 2 via ORAL
  Filled 2018-10-23 (×3): qty 2

## 2018-10-23 MED ORDER — CHLORHEXIDINE GLUCONATE 4 % EX LIQD: 60.0000 mL | Freq: Once | CUTANEOUS | Status: DC

## 2018-10-23 MED ORDER — BISACODYL 10 MG RE SUPP: 10.0000 mg | Freq: Every day | RECTAL | Status: DC | PRN

## 2018-10-23 MED ORDER — 0.9 % SODIUM CHLORIDE (POUR BTL) OPTIME
TOPICAL | Status: DC | PRN
  Administered 2018-10-23 (×2): 1000 mL

## 2018-10-23 MED ORDER — LISDEXAMFETAMINE DIMESYLATE 70 MG PO CAPS: 70.0000 mg | ORAL_CAPSULE | Freq: Every day | ORAL | Status: DC

## 2018-10-23 MED ORDER — MIDAZOLAM HCL 2 MG/2ML IJ SOLN
1.0000 mg | Freq: Once | INTRAMUSCULAR | Status: AC
  Administered 2018-10-23: 2 mg via INTRAVENOUS
  Filled 2018-10-23: qty 2

## 2018-10-23 MED ORDER — MEPERIDINE HCL 50 MG/ML IJ SOLN: 6.2500 mg | INTRAMUSCULAR | Status: DC | PRN

## 2018-10-23 MED ORDER — PROPOFOL 10 MG/ML IV BOLUS
INTRAVENOUS | Status: AC
  Filled 2018-10-23: qty 40

## 2018-10-23 MED ORDER — METHOCARBAMOL 500 MG IVPB - SIMPLE MED
500.0000 mg | Freq: Four times a day (QID) | INTRAVENOUS | Status: DC | PRN
  Filled 2018-10-23: qty 50

## 2018-10-23 MED ORDER — DEXAMETHASONE SODIUM PHOSPHATE 10 MG/ML IJ SOLN
INTRAMUSCULAR | Status: AC
  Filled 2018-10-23: qty 1

## 2018-10-23 MED ORDER — PROPOFOL 500 MG/50ML IV EMUL
INTRAVENOUS | Status: DC | PRN
  Administered 2018-10-23: 75 ug/kg/min via INTRAVENOUS

## 2018-10-23 MED ORDER — ONDANSETRON HCL 4 MG/2ML IJ SOLN
INTRAMUSCULAR | Status: AC
  Filled 2018-10-23: qty 2

## 2018-10-23 MED ORDER — TRANEXAMIC ACID-NACL 1000-0.7 MG/100ML-% IV SOLN
1000.0000 mg | INTRAVENOUS | Status: AC
  Administered 2018-10-23: 1000 mg via INTRAVENOUS
  Filled 2018-10-23: qty 100

## 2018-10-23 MED ORDER — ROSUVASTATIN CALCIUM 5 MG PO TABS: 5.0000 mg | ORAL_TABLET | Freq: Every day | ORAL | Status: DC

## 2018-10-23 MED ORDER — ACETAMINOPHEN 10 MG/ML IV SOLN
1000.0000 mg | Freq: Four times a day (QID) | INTRAVENOUS | Status: DC
  Administered 2018-10-23: 1000 mg via INTRAVENOUS
  Filled 2018-10-23: qty 100

## 2018-10-23 MED ORDER — MENTHOL 3 MG MT LOZG: 1.0000 | LOZENGE | OROMUCOSAL | Status: DC | PRN

## 2018-10-23 MED ORDER — ROPIVACAINE HCL 5 MG/ML IJ SOLN
INTRAMUSCULAR | Status: DC | PRN
  Administered 2018-10-23: 30 mL via PERINEURAL

## 2018-10-23 MED ORDER — POLYETHYLENE GLYCOL 3350 17 G PO PACK: 17.0000 g | PACK | Freq: Every day | ORAL | Status: DC | PRN

## 2018-10-23 MED ORDER — BUPIVACAINE LIPOSOME 1.3 % IJ SUSP
INTRAMUSCULAR | Status: DC | PRN
  Administered 2018-10-23: 80 mL

## 2018-10-23 MED ORDER — ACETAMINOPHEN 10 MG/ML IV SOLN: 1000.0000 mg | Freq: Once | INTRAVENOUS | Status: DC | PRN

## 2018-10-23 MED ORDER — DEXAMETHASONE SODIUM PHOSPHATE 10 MG/ML IJ SOLN
10.0000 mg | Freq: Once | INTRAMUSCULAR | Status: AC
  Administered 2018-10-24: 10 mg via INTRAVENOUS
  Filled 2018-10-23: qty 1

## 2018-10-23 MED ORDER — PROMETHAZINE HCL 25 MG/ML IJ SOLN: 6.2500 mg | INTRAMUSCULAR | Status: DC | PRN

## 2018-10-23 MED ORDER — LACTATED RINGERS IV SOLN
INTRAVENOUS | Status: DC
  Administered 2018-10-23 (×2): via INTRAVENOUS

## 2018-10-23 MED ORDER — HYDROMORPHONE HCL 1 MG/ML IJ SOLN: 0.2500 mg | INTRAMUSCULAR | Status: DC | PRN

## 2018-10-23 MED ORDER — MORPHINE SULFATE (PF) 2 MG/ML IV SOLN
0.5000 mg | INTRAVENOUS | Status: DC | PRN
  Administered 2018-10-24: 1 mg via INTRAVENOUS
  Filled 2018-10-23: qty 1

## 2018-10-23 MED ORDER — MIDAZOLAM HCL 2 MG/2ML IJ SOLN
INTRAMUSCULAR | Status: AC
  Filled 2018-10-23: qty 2

## 2018-10-23 MED ORDER — METHOCARBAMOL 500 MG PO TABS
500.0000 mg | ORAL_TABLET | Freq: Four times a day (QID) | ORAL | Status: DC | PRN
  Administered 2018-10-24 (×2): 500 mg via ORAL
  Filled 2018-10-23 (×2): qty 1

## 2018-10-23 MED ORDER — EPHEDRINE SULFATE-NACL 50-0.9 MG/10ML-% IV SOSY
PREFILLED_SYRINGE | INTRAVENOUS | Status: DC | PRN
  Administered 2018-10-23 (×2): 5 mg via INTRAVENOUS

## 2018-10-23 MED ORDER — TRANEXAMIC ACID 1000 MG/10ML IV SOLN
INTRAVENOUS | Status: DC | PRN
  Administered 2018-10-23: 2000 mg via TOPICAL

## 2018-10-23 MED ORDER — SODIUM CHLORIDE (PF) 0.9 % IJ SOLN
INTRAMUSCULAR | Status: DC | PRN
  Administered 2018-10-23: 60 mL

## 2018-10-23 MED ORDER — FENTANYL CITRATE (PF) 100 MCG/2ML IJ SOLN
INTRAMUSCULAR | Status: DC | PRN
  Administered 2018-10-23 (×2): 50 ug via INTRAVENOUS

## 2018-10-23 MED ORDER — FLUTICASONE PROPIONATE 50 MCG/ACT NA SUSP
1.0000 | Freq: Every day | NASAL | Status: DC
  Filled 2018-10-23: qty 16

## 2018-10-23 MED ORDER — CEFAZOLIN SODIUM-DEXTROSE 2-4 GM/100ML-% IV SOLN
2.0000 g | Freq: Four times a day (QID) | INTRAVENOUS | Status: AC
  Administered 2018-10-23 – 2018-10-24 (×2): 2 g via INTRAVENOUS
  Filled 2018-10-23 (×2): qty 100

## 2018-10-23 MED ORDER — GABAPENTIN 300 MG PO CAPS
300.0000 mg | ORAL_CAPSULE | Freq: Once | ORAL | Status: AC
  Administered 2018-10-23: 300 mg via ORAL
  Filled 2018-10-23: qty 1

## 2018-10-23 MED ORDER — BUPIVACAINE IN DEXTROSE 0.75-8.25 % IT SOLN
INTRATHECAL | Status: DC | PRN
  Administered 2018-10-23: 1.7 mL via INTRATHECAL

## 2018-10-23 MED ORDER — FENTANYL CITRATE (PF) 100 MCG/2ML IJ SOLN
INTRAMUSCULAR | Status: AC
  Filled 2018-10-23: qty 2

## 2018-10-23 MED ORDER — PHENOL 1.4 % MT LIQD: 1.0000 | OROMUCOSAL | Status: DC | PRN

## 2018-10-23 MED ORDER — HYDROCODONE-ACETAMINOPHEN 5-325 MG PO TABS
1.0000 | ORAL_TABLET | ORAL | Status: DC | PRN
  Administered 2018-10-23: 1 via ORAL
  Filled 2018-10-23: qty 1

## 2018-10-23 MED ORDER — ASPIRIN EC 325 MG PO TBEC
325.0000 mg | DELAYED_RELEASE_TABLET | Freq: Two times a day (BID) | ORAL | Status: DC
  Administered 2018-10-24: 325 mg via ORAL
  Filled 2018-10-23: qty 1

## 2018-10-23 SURGICAL SUPPLY — 54 items
BAG DECANTER FOR FLEXI CONT (MISCELLANEOUS) ×3 IMPLANT
BAG ZIPLOCK 12X15 (MISCELLANEOUS) IMPLANT
BANDAGE ACE 6X5 VEL STRL LF (GAUZE/BANDAGES/DRESSINGS) ×3 IMPLANT
BLADE SAG 18X100X1.27 (BLADE) ×3 IMPLANT
BLADE SAW SGTL 11.0X1.19X90.0M (BLADE) IMPLANT
CEMENT HV SMART SET (Cement) ×3 IMPLANT
CLOSURE WOUND 1/2 X4 (GAUZE/BANDAGES/DRESSINGS) ×1
CLOTH BEACON ORANGE TIMEOUT ST (SAFETY) ×3 IMPLANT
COVER SURGICAL LIGHT HANDLE (MISCELLANEOUS) ×3 IMPLANT
COVER WAND RF STERILE (DRAPES) ×3 IMPLANT
CUFF TOURN SGL QUICK 34 (TOURNIQUET CUFF) ×2
CUFF TRNQT CYL 34X4X40X1 (TOURNIQUET CUFF) ×1 IMPLANT
DECANTER SPIKE VIAL GLASS SM (MISCELLANEOUS) ×3 IMPLANT
DRAPE U-SHAPE 47X51 STRL (DRAPES) ×3 IMPLANT
DRSG ADAPTIC 3X8 NADH LF (GAUZE/BANDAGES/DRESSINGS) ×3 IMPLANT
DRSG PAD ABDOMINAL 8X10 ST (GAUZE/BANDAGES/DRESSINGS) ×3 IMPLANT
DURAPREP 26ML APPLICATOR (WOUND CARE) ×3 IMPLANT
ELECT REM PT RETURN 15FT ADLT (MISCELLANEOUS) ×3 IMPLANT
EVACUATOR 1/8 PVC DRAIN (DRAIN) ×3 IMPLANT
GAUZE SPONGE 4X4 12PLY STRL (GAUZE/BANDAGES/DRESSINGS) ×3 IMPLANT
GLOVE BIO SURGEON STRL SZ7 (GLOVE) ×3 IMPLANT
GLOVE BIO SURGEON STRL SZ8 (GLOVE) ×3 IMPLANT
GLOVE BIOGEL PI IND STRL 7.0 (GLOVE) ×1 IMPLANT
GLOVE BIOGEL PI IND STRL 8 (GLOVE) ×1 IMPLANT
GLOVE BIOGEL PI INDICATOR 7.0 (GLOVE) ×2
GLOVE BIOGEL PI INDICATOR 8 (GLOVE) ×2
GOWN STRL REUS W/TWL LRG LVL3 (GOWN DISPOSABLE) ×12 IMPLANT
GOWN STRL REUS W/TWL XL LVL3 (GOWN DISPOSABLE) ×3 IMPLANT
HANDPIECE INTERPULSE COAX TIP (DISPOSABLE) ×2
HOLDER FOLEY CATH W/STRAP (MISCELLANEOUS) ×3 IMPLANT
IMMOBILIZER KNEE 20 (SOFTGOODS) ×3
IMMOBILIZER KNEE 20 THIGH 36 (SOFTGOODS) ×1 IMPLANT
INSERT PFC SIG STB SZ4 17.5MM (Knees) ×3 IMPLANT
MANIFOLD NEPTUNE II (INSTRUMENTS) ×3 IMPLANT
NS IRRIG 1000ML POUR BTL (IV SOLUTION) ×3 IMPLANT
PACK TOTAL KNEE CUSTOM (KITS) ×3 IMPLANT
PADDING CAST COTTON 6X4 STRL (CAST SUPPLIES) ×6 IMPLANT
PATELLA DOME PFC 41MM (Knees) ×3 IMPLANT
POSITIONER SURGICAL ARM (MISCELLANEOUS) ×3 IMPLANT
SET HNDPC FAN SPRY TIP SCT (DISPOSABLE) ×1 IMPLANT
STRIP CLOSURE SKIN 1/2X4 (GAUZE/BANDAGES/DRESSINGS) ×2 IMPLANT
SUT MNCRL AB 4-0 PS2 18 (SUTURE) ×3 IMPLANT
SUT STRATAFIX 0 PDS 27 VIOLET (SUTURE) ×6
SUT VIC AB 2-0 CT1 27 (SUTURE) ×6
SUT VIC AB 2-0 CT1 TAPERPNT 27 (SUTURE) ×3 IMPLANT
SUTURE STRATFX 0 PDS 27 VIOLET (SUTURE) ×2 IMPLANT
SWAB COLLECTION DEVICE MRSA (MISCELLANEOUS) IMPLANT
SWAB CULTURE ESWAB REG 1ML (MISCELLANEOUS) IMPLANT
SYR 50ML LL SCALE MARK (SYRINGE) ×6 IMPLANT
TOWER CARTRIDGE SMART MIX (DISPOSABLE) IMPLANT
TRAY FOLEY MTR SLVR 16FR STAT (SET/KITS/TRAYS/PACK) ×3 IMPLANT
TUBE KAMVAC SUCTION (TUBING) IMPLANT
WATER STERILE IRR 1000ML POUR (IV SOLUTION) ×6 IMPLANT
WRAP KNEE MAXI GEL POST OP (GAUZE/BANDAGES/DRESSINGS) ×3 IMPLANT

## 2018-10-23 NOTE — Anesthesia Postprocedure Evaluation (Signed)
Anesthesia Post Note  Patient: Russell Gillespie  Procedure(s) Performed: left knee polyethylene exchange with patella resurfacing (Left )     Patient location during evaluation: PACU Anesthesia Type: Spinal Level of consciousness: oriented and awake and alert Pain management: pain level controlled Vital Signs Assessment: post-procedure vital signs reviewed and stable Respiratory status: spontaneous breathing, respiratory function stable and patient connected to nasal cannula oxygen Cardiovascular status: blood pressure returned to baseline and stable Postop Assessment: no headache, no backache and no apparent nausea or vomiting Anesthetic complications: no    Last Vitals:  Vitals:   10/23/18 1930 10/23/18 2045  BP: 126/77 126/79  Pulse: (!) 58 (!) 53  Resp: 13   Temp: 36.5 C (!) 36.3 C  SpO2: 100% 100%    Last Pain:  Vitals:   10/23/18 2045  TempSrc: Oral  PainSc:                  Trevor IhaStephen A 

## 2018-10-23 NOTE — Interval H&P Note (Signed)
[id number] [phone]([phone] sales r resentativKinnie [phone] sales representativeeticsCritic.siei74BPage Memorial 38[facility]ospH[facility] [phone] [phone] Mess37

## 2018-10-23 NOTE — Progress Notes (Signed)
AssistedDr. Houser with left, ultrasound guided, adductor canal block. Side rails up, monitors on throughout procedure. See vital signs in flow sheet. Tolerated Procedure well.  

## 2018-10-23 NOTE — Anesthesia Procedure Notes (Addendum)
Spinal  Patient location during procedure: OB Start time: 10/23/2018 4:43 PM End time: 10/23/2018 4:49 PM Staffing Anesthesiologist: Trevor IhaHouser, Stephen A, MD Performed: anesthesiologist  Preanesthetic Checklist Completed: patient identified, surgical consent, pre-op evaluation, timeout performed, IV checked, risks and benefits discussed and monitors and equipment checked Spinal Block Patient position: sitting Prep: site prepped and draped and DuraPrep Patient monitoring: heart rate, cardiac monitor, continuous pulse ox and blood pressure Approach: midline Location: L3-4 Injection technique: single-shot Needle Needle type: Pencan  Needle gauge: 24 G Needle length: 10 cm Assessment Sensory level: T4 Additional Notes 2 attempts. Pt tolerated procedure well.

## 2018-10-23 NOTE — Anesthesia Procedure Notes (Signed)
Procedure Name: MAC Date/Time: 10/23/2018 4:43 PM Performed by: West Pugh, CRNA Pre-anesthesia Checklist: Patient identified, Emergency Drugs available, Suction available, Patient being monitored and Timeout performed Patient Re-evaluated:Patient Re-evaluated prior to induction Oxygen Delivery Method: Simple face mask Preoxygenation: Pre-oxygenation with 100% oxygen Induction Type: IV induction Placement Confirmation: positive ETCO2 Dental Injury: Teeth and Oropharynx as per pre-operative assessment

## 2018-10-23 NOTE — Anesthesia Procedure Notes (Signed)
Anesthesia Regional Block: Adductor canal block   Pre-Anesthetic Checklist: ,, timeout performed, Correct Patient, Correct Site, Correct Laterality, Correct Procedure, Correct Position, site marked, Risks and benefits discussed,  Surgical consent,  Pre-op evaluation,  At surgeon's request and post-op pain management  Laterality: Left  Prep: Maximum Sterile Barrier Precautions used, chloraprep       Needles:  Injection technique: Single-shot  Needle Type: Echogenic Needle     Needle Length: 9cm  Needle Gauge: 21     Additional Needles:   Procedures:,,,, ultrasound used (permanent image in chart),,,,  Narrative:  Start time: 10/23/2018 3:10 PM End time: 10/23/2018 3:20 PM Injection made incrementally with aspirations every 5 mL.  Performed by: Personally  Anesthesiologist: Trevor IhaHouser, Darrion Wyszynski A, MD  Additional Notes: 1 attempt . Pt tolerated procedure well

## 2018-10-23 NOTE — Transfer of Care (Signed)
Immediate Anesthesia Transfer of Care Note  Patient: Russell Gillespie  Procedure(s) Performed: left knee polyethylene exchange with patella resurfacing (Left )  Patient Location: PACU  Anesthesia Type:Spinal and MAC combined with regional for post-op pain  Level of Consciousness: awake, alert , oriented and patient cooperative  Airway & Oxygen Therapy: Patient Spontanous Breathing and Patient connected to nasal cannula oxygen  Post-op Assessment: Report given to RN and Post -op Vital signs reviewed and stable  Post vital signs: Reviewed and stable  Last Vitals:  Vitals Value Taken Time  BP    Temp    Pulse    Resp    SpO2      Last Pain:  Vitals:   10/23/18 1550  TempSrc:   PainSc: 0-No pain         Complications: No apparent anesthesia complications

## 2018-10-23 NOTE — Brief Op Note (Signed)
10/23/2018  8:27 PM  PATIENT:  Russell Gillespie  57 y.o. male  PRE-OPERATIVE DIAGNOSIS:  left knee total knee arthroplasty instability  POST-OPERATIVE DIAGNOSIS:  left knee total knee arthroplasty instability  PROCEDURE:  Procedure(s) with comments: left knee polyethylene exchange with patella resurfacing (Left) - 90min  SURGEON:  Surgeon(s) and Role:    Ollen Gross* Keirstan Iannello, MD - Primary  PHYSICIAN ASSISTANT:   ASSISTANTS: Arther AbbottKristie Edmisten, PA-C   ANESTHESIA:   spinal  And adductor canal block  EBL:  25 mL   DRAINS: (Medium) Hemovact drain(s) in the left knee with  Suction Open   LOCAL MEDICATIONS USED:  OTHER Exparel  COUNTS:  YES  TOURNIQUET:   Total Tourniquet Time Documented: Thigh (Left) - 34 minutes Total: Thigh (Left) - 34 minutes   DICTATION: .Other Dictation: Dictation Number [id number]  PLAN OF CARE: Admit to inpatient   PATIENT DISPOSITION:  PACU - hemodynamically stable.

## 2018-10-24 ENCOUNTER — Encounter (HOSPITAL_COMMUNITY): Payer: Self-pay | Admitting: Orthopedic Surgery

## 2018-10-24 ENCOUNTER — Other Ambulatory Visit: Payer: Self-pay

## 2018-10-24 LAB — BASIC METABOLIC PANEL
Anion gap: 8 (ref 5–15)
BUN: 13 mg/dL (ref 6–20)
CALCIUM: 8.7 mg/dL — AB (ref 8.9–10.3)
CO2: 25 mmol/L (ref 22–32)
Chloride: 104 mmol/L (ref 98–111)
Creatinine, Ser: 0.79 mg/dL (ref 0.61–1.24)
GFR calc Af Amer: >60 mL/min (ref >60)
GLUCOSE: 224 mg/dL — AB (ref 70–99)
POTASSIUM: 4.1 mmol/L (ref 3.5–5.1)
SODIUM: 137 mmol/L (ref 135–145)

## 2018-10-24 LAB — CBC
HCT: 38.8 % — ABNORMAL LOW (ref 39.0–52.0)
Hemoglobin: 13 g/dL (ref 13.0–17.0)
MCH: 31.6 pg (ref 26.0–34.0)
MCHC: 33.5 g/dL (ref 30.0–36.0)
MCV: 94.4 fL (ref 80.0–100.0)
PLATELETS: 223 10*3/uL (ref 150–400)
RBC: 4.11 MIL/uL — AB (ref 4.22–5.81)
RDW: 12.1 % (ref 11.5–15.5)
WBC: 9.5 10*3/uL (ref 4.0–10.5)
nRBC: 0 % (ref 0.0–0.2)

## 2018-10-24 MED ORDER — METHOCARBAMOL 500 MG PO TABS: 500.0000 mg | ORAL_TABLET | Freq: Four times a day (QID) | ORAL | 0 refills | Status: DC | PRN

## 2018-10-24 MED ORDER — HYDROCODONE-ACETAMINOPHEN 5-325 MG PO TABS: 1.0000 | ORAL_TABLET | Freq: Four times a day (QID) | ORAL | 0 refills | Status: DC | PRN

## 2018-10-24 MED ORDER — ASPIRIN 325 MG PO TBEC: 325.0000 mg | DELAYED_RELEASE_TABLET | Freq: Two times a day (BID) | ORAL | 0 refills | Status: AC

## 2018-10-24 NOTE — Op Note (Signed)
NAME: Russell Gillespie, Russell Gillespie MEDICAL RECORD ZO:[id number] ACCOUNT [id number] DATE OF BIRTH:06-Oct-1961 FACILITY: [facility] LOCATION: [facility] PHYSICIAN:Axl Rodino Dulcy Fanny, MD  OPERATIVE REPORT  DATE OF PROCEDURE:  10/23/2018  PREOPERATIVE DIAGNOSIS:  Painful unstable left total knee arthroplasty.  POSTOPERATIVE DIAGNOSIS:  Painful unstable left total knee arthroplasty.  PROCEDURE:  Left knee tibial polyethylene revision and patellar component revision.  SURGEON:  Ollen Gross, MD  ASSISTANT:  Arther Abbott, PA-C  ANESTHESIA:  Adductor canal block and spinal.  ESTIMATED BLOOD LOSS:  Minimal.  DRAINS:  Hemovac x1.  TOURNIQUET TIME:  35 minutes at 300 mmHg.  COMPLICATIONS:  None.  CONDITION:  Stable to recovery.  BRIEF CLINICAL NOTE:  The patient is a 57 year old male who had a left total knee arthroplasty done in 2012.  He recently has developed recurrent effusions and instability in the knee.  He does not recall one specific injury which would have led to this.   A bone scan was negative for loosening.  He presents now for polyethylene versus total knee revision.  PROCEDURE IN DETAIL:  After successful administration of adductor canal block and spinal anesthetic, a tourniquet was placed high on the left thigh and left lower extremity prepped and draped in the usual sterile fashion.  Extremity was wrapped in  Esmarch and tourniquet inflated to 300 mmHg.  Midline incision was made with a #10 blade through subcutaneous tissue to the level of the extensor mechanism.  A fresh blade used to make a medial parapatellar arthrotomy.  There was fair amount of blood in  the knee.  This was evacuated and the knee was irrigated.  Synovectomy was performed.  I was able to remove the tibial polyethylene from the tibial tray.  The interface between the metal and bone was cleared of all soft tissue and both the tibia and  femoral components were found to be stable with no evidence of any loosening.  A  12.5 mm rotating platform posterior stabilized insert had been in place for the size 4 prosthesis.  I went up to 17.5 trial, which allowed for full extension with excellent  varus valgus and anterior posterior balance throughout full range of motion.  Upon inspecting the patellar component, there was a large divot in the central aspect of the patella.  There was actually a piece of polyethylene found in the knee, which would  correspond with this divot.  It was felt that the patellar component was damaged.  I was able to remove the patellar component with an oscillating saw.  The residual thickness of the tibia was a 13 mm.  A 41 template was placed.  Lug holes were drilled,  trial patella was placed and it tracks normally.  We then removed the trial and prepared the bone with pulsatile lavage.  One batch of cement was mixed and the 41 patellar component was cemented and held with a patellar clamp.  While this was hardening,  I placed the permanent 17.5 mm rotating platform posterior stabilized insert into the tibial tray.  The knee was reduced with outstanding stability.  When the cement hardened, the patellar clamp was removed and then Exparel 20 mL mixed with 60 mL of  saline was injected into the extensor mechanism periosteum of the femur and capsular tissues.  Wound was further irrigated and the arthrotomy closed over a Hemovac drain with a running 0 Stratafix suture.  Flexion against gravity was 130 degrees.   Tourniquet was released, total time of 35 minutes.  Subcutaneous was closed with  interrupted 2-0 Vicryl and subcuticular running 4-0 Monocryl.  Incisions cleaned and dried and Steri-Strips and a bulky sterile dressing applied.  He was then placed into a  knee immobilizer, awakened and transported to recovery in stable condition.  Note that a surgical assistant was a medical necessity for this procedure to do it in a safe and expeditious manner.  Surgical assistant was necessary for retraction of  vital ligaments and neurovascular structures and for proper positioning of the limb,  removal of the old implant and safe and accurate placement of the new implant.  TN/NUANCE  D:10/23/2018 T:10/24/2018 JOB:[id number]

## 2018-10-24 NOTE — Progress Notes (Signed)
Physical Therapy Treatment Patient Details Name: Russell Gillespie MRN: [id number] DOB: 1961/04/04 Today's Date: 10/24/2018    History of Present Illness Pt s/p L TKR poly exchange with patella resurfacing.  Pt with hx of L TKR and R THR and back surgery x 2    PT Comments    Pt progressing well with mobility and eager for dc home.  Pt negotiated stairs and performed home therex program with assist and with written instruction provided.   Follow Up Recommendations  Outpatient PT     Equipment Recommendations  None recommended by PT    Recommendations for Other Services       Precautions / Restrictions Precautions Precautions: Fall;Knee Restrictions Weight Bearing Restrictions: No Other Position/Activity Restrictions: WBAT    Mobility  Bed Mobility Overal bed mobility: Needs Assistance Bed Mobility: Supine to Sit     Supine to sit: Supervision     General bed mobility comments: min cues  Transfers Overall transfer level: Needs assistance Equipment used: Rolling walker (2 wheeled) Transfers: Sit to/from Stand Sit to Stand: Supervision         General transfer comment: min cues for LE management and use of UEs to self assist  Ambulation/Gait Ambulation/Gait assistance: Supervision Gait Distance (Feet): 200 Feet Assistive device: Rolling walker (2 wheeled) Gait Pattern/deviations: Step-to pattern;Step-through pattern;Decreased step length - right;Decreased step length - left;Shuffle;Trunk flexed Gait velocity: decr   General Gait Details: pt self cues for sequence, posture and position from RW   Stairs Stairs: Yes Stairs assistance: Min guard Stair Management: One rail Right;Step to pattern;Two rails;Forwards;With crutches Number of Stairs: 8 General stair comments: 4 stairs with bil rails and 4 stairs with rail and crutch   Wheelchair Mobility    Modified Rankin (Stroke Patients Only)       Balance Overall balance assessment: Mild  deficits observed, not formally tested                                          Cognition Arousal/Alertness: Awake/alert Behavior During Therapy: WFL for tasks assessed/performed Overall Cognitive Status: Within Functional Limits for tasks assessed                                        Exercises Total Joint Exercises Ankle Circles/Pumps: AROM;Both;20 reps;Supine Quad Sets: AROM;Both;10 reps;Supine Heel Slides: AAROM;Left;Supine;20 reps Straight Leg Raises: AAROM;AROM;Left;Supine;20 reps Long Arc Quad: AROM;Left;5 reps;Seated Knee Flexion: AROM;AAROM;Left;5 reps;Seated    General Comments        Pertinent Vitals/Pain Pain Assessment: 0-10 Pain Score: 5  Pain Location: L knee Pain Descriptors / Indicators: Aching;Sore Pain Intervention(s): Limited activity within patient's tolerance;Monitored during session;Premedicated before session;Ice applied    Home Living Family/patient expects to be discharged to:: Private residence Living Arrangements: Spouse/significant other Available Help at Discharge: Family Type of Home: House Home Access: Stairs to enter Entrance Stairs-Rails: Right;Left;Can reach both Home Layout: Two level Home Equipment: Environmental consultant - 2 wheels;Bedside commode;Crutches      Prior Function Level of Independence: Independent;Independent with assistive device(s)      Comments: using crutch for support as needed   PT Goals (current goals can now be found in the care plan section) Acute Rehab PT Goals Patient Stated Goal: regain IND PT Goal Formulation: With patient Time For Goal Achievement: 10/31/18 Potential  to Achieve Goals: Good Progress towards PT goals: Progressing toward goals    Frequency    7X/week      PT Plan Current plan remains appropriate    Co-evaluation              AM-PAC PT "6 Clicks" Daily Activity  Outcome Measure  Difficulty turning over in bed (including adjusting bedclothes, sheets  and blankets)?: A Lot Difficulty moving from lying on back to sitting on the side of the bed? : A Lot Difficulty sitting down on and standing up from a chair with arms (e.g., wheelchair, bedside commode, etc,.)?: A Lot Help needed moving to and from a bed to chair (including a wheelchair)?: A Little Help needed walking in hospital room?: A Little Help needed climbing 3-5 steps with a railing? : A Little 6 Click Score: 15    End of Session Equipment Utilized During Treatment: Gait belt Activity Tolerance: Patient tolerated treatment well Patient left: in chair;with call bell/phone within reach Nurse Communication: Mobility status PT Visit Diagnosis: Difficulty in walking, not elsewhere classified (R26.2)     Time: [id number] PT Time Calculation (min) (ACUTE ONLY): 39 min  Charges:  $Gait Training: 23-37 mins $Therapeutic Exercise: 8-22 mins                     Russell Gillespie PT Acute Rehabilitation Services Pager [phone] Office [phone]    Russell Gillespie 10/24/2018, 1:37 PM

## 2018-10-24 NOTE — Progress Notes (Signed)
   Subjective: 1 Day Post-Op Procedure(s) (LRB): left knee polyethylene exchange with patella resurfacing (Left) Patient reports pain as mild.   Patient seen in rounds with Dr. Lequita HaltAluisio. Patient is well, and has had no acute complaints or problems other than discomfort in the left knee. Foley catheter removed this AM. Denies chest pain, SOB, or calf pain. No issues overnight.  We will start therapy today.   Objective: Vital signs in last 24 hours: Temp:  [97.4 F (36.3 C)-98.3 F (36.8 C)] 98.3 F (36.8 C) (11/14 0521) Pulse Rate:  [32-103] 62 (11/14 0521) Resp:  [9-34] 17 (11/14 0521) BP: (107-149)/(65-97) 124/73 (11/14 0521) SpO2:  [93 %-100 %] 98 % (11/14 0521) Weight:  [109.3 kg] 109.3 kg (11/13 1355)  Intake/Output from previous day:  Intake/Output Summary (Last 24 hours) at 10/24/2018 0710 Last data filed at 10/24/2018 0635 Gross per 24 hour  Intake 3247.67 ml  Output 2160 ml  Net 1087.67 ml    Labs: Recent Labs    10/24/18 0406  HGB 13.0   Recent Labs    10/24/18 0406  WBC 9.5  RBC 4.11*  HCT 38.8*  PLT 223   Recent Labs    10/24/18 0406  NA 137  K 4.1  CL 104  CO2 25  BUN 13  CREATININE 0.79  GLUCOSE 224*  CALCIUM 8.7*   Exam: General - Patient is Alert and Oriented Extremity - Neurologically intact Neurovascular intact Sensation intact distally Dorsiflexion/Plantar flexion intact Dressing - dressing C/D/I Motor Function - intact, moving foot and toes well on exam.   Past Medical History:  Diagnosis Date  . Amblyopia of eye, left    reports it is "incorrectable"   . Chronic kidney disease    kidney stones  . Dysrhythmia    occasional irregular HR  . Family history of adverse reaction to anesthesia    Mother gets PONV  . Hemorrhoids   . Internal thrombosed hemorrhoid-left lateral 05/23/2012  . Irregular heart beat    reports " they told me i had a heart murmur since i was a kid in school" reports as nonsymptomatic   . Radiculopathy  of lumbar region   . Tinnitus    bilateral   . Tuberculosis    tests positively on TB skin tests    Assessment/Plan: 1 Day Post-Op Procedure(s) (LRB): left knee polyethylene exchange with patella resurfacing (Left) Principal Problem:   Failed total knee arthroplasty (HCC)  Estimated body mass index is 31.8 kg/m as calculated from the following:   Height as of this encounter: 6\' 1"  (1.854 m).   Weight as of this encounter: 109.3 kg. Advance diet Up with therapy D/C IV fluids  DVT Prophylaxis - Aspirin Weight bearing as tolerated. D/C O2 and pulse ox and try on room air. Hemovac pulled without difficulty, will begin therapy.  Plan is to go Home after hospital stay. Possible discharge this afternoon if progresses with therapy and meeting his goals. [facility] Follow-up in the office in 2 weeks with Dr. Lequita HaltAluisio.  Arther AbbottKristie Rollin Kotowski, PA-C Orthopedic Surgery 10/24/2018, 7:10 AM

## 2018-10-24 NOTE — Plan of Care (Signed)
Patient to discharge home. Discharge instructions given to patient and wife, both verbalized understanding. IV dc'd. Rx given.

## 2018-10-24 NOTE — Evaluation (Signed)
Physical Therapy Evaluation Patient Details Name: Russell Gillespie MRN: [id number] DOB: [birth year] Today's Date: 10/24/2018   History of Present Illness  Pt s/p L TKR poly exchange with patella resurfacing.  Pt with hx of L TKR and R THR and back surgery x 2  Clinical Impression  Pt s/p L TKR revision and presents with decreased L LE strength/ROM and post op pain limiting functional mobility.  Pt should progress to dc home with family assist.    Follow Up Recommendations Outpatient PT    Equipment Recommendations  None recommended by PT    Recommendations for Other Services       Precautions / Restrictions Precautions Precautions: Fall;Knee Restrictions Weight Bearing Restrictions: No Other Position/Activity Restrictions: WBAT      Mobility  Bed Mobility Overal bed mobility: Needs Assistance Bed Mobility: Supine to Sit     Supine to sit: Supervision     General bed mobility comments: min cues  Transfers Overall transfer level: Needs assistance   Transfers: Sit to/from Stand Sit to Stand: Min guard         General transfer comment: cues for LE management and use of UEs to self assist  Ambulation/Gait Ambulation/Gait assistance: Min guard Gait Distance (Feet): 100 Feet Assistive device: Rolling walker (2 wheeled) Gait Pattern/deviations: Step-to pattern;Step-through pattern;Decreased step length - right;Decreased step length - left;Shuffle;Trunk flexed Gait velocity: decr   General Gait Details: cues for sequence, posture and position from [employer]            Wheelchair Mobility    Modified Rankin (Stroke Patients Only)       Balance                                             Pertinent Vitals/Pain Pain Assessment: 0-10 Pain Score: 6  Pain Location: L knee Pain Descriptors / Indicators: Aching;Sore Pain Intervention(s): Limited activity within patient's tolerance;Monitored during session;Premedicated before  session;Ice applied    Home Living Family/patient expects to be discharged to:: Private residence Living Arrangements: Spouse/significant other Available Help at Discharge: Family Type of Home: House Home Access: Stairs to enter Entrance Stairs-Rails: Right;Left;Can reach both Entrance Stairs-Number of Steps: 4 Home Layout: Two level Home Equipment: Environmental consultant - 2 wheels;Bedside commode;Crutches      Prior Function Level of Independence: Independent;Independent with assistive device(s)         Comments: using crutch for support as needed     Hand Dominance        Extremity/Trunk Assessment   Upper Extremity Assessment Upper Extremity Assessment: Overall WFL for tasks assessed    Lower Extremity Assessment Lower Extremity Assessment: LLE deficits/detail LLE Deficits / Details: 3/5 quads with IND SLR; AAROM at knee -10 - 85    Cervical / Trunk Assessment Cervical / Trunk Assessment: Normal  Communication   Communication: No difficulties  Cognition Arousal/Alertness: Awake/alert Behavior During Therapy: WFL for tasks assessed/performed Overall Cognitive Status: Within Functional Limits for tasks assessed                                        General Comments      Exercises Total Joint Exercises Ankle Circles/Pumps: AROM;Both;20 reps;Supine Quad Sets: AROM;Both;10 reps;Supine Heel Slides: AAROM;Left;15 reps;Supine Straight Leg Raises: AAROM;AROM;Left;15 reps;Supine   Assessment/Plan  PT Assessment Patient needs continued PT services  PT Problem List Decreased strength;Decreased range of motion;Decreased activity tolerance;Decreased mobility;Pain;Decreased knowledge of use of DME       PT Treatment Interventions DME instruction;Gait training;Stair training;Functional mobility training;Therapeutic activities;Therapeutic exercise;Patient/family education    PT Goals (Current goals can be found in the Care Plan section)  Acute Rehab PT  Goals Patient Stated Goal: regain IND PT Goal Formulation: With patient Time For Goal Achievement: 10/31/18 Potential to Achieve Goals: Good    Frequency 7X/week   Barriers to discharge        Co-evaluation               AM-PAC PT "6 Clicks" Daily Activity  Outcome Measure Difficulty turning over in bed (including adjusting bedclothes, sheets and blankets)?: A Lot Difficulty moving from lying on back to sitting on the side of the bed? : A Lot Difficulty sitting down on and standing up from a chair with arms (e.g., wheelchair, bedside commode, etc,.)?: A Lot Help needed moving to and from a bed to chair (including a wheelchair)?: A Little Help needed walking in hospital room?: A Little Help needed climbing 3-5 steps with a railing? : A Little 6 Click Score: 15    End of Session Equipment Utilized During Treatment: Gait belt Activity Tolerance: Patient tolerated treatment well Patient left: in chair;with call bell/phone within reach Nurse Communication: Mobility status PT Visit Diagnosis: Difficulty in walking, not elsewhere classified (R26.2)    Time: [id number] PT Time Calculation (min) (ACUTE ONLY): 34 min   Charges:   PT Evaluation $PT Eval Low Complexity: 1 Low PT Treatments $Therapeutic Exercise: 8-22 mins        Mauro KaufmannHunter Chandy Tarman PT Acute Rehabilitation Services Pager [phone] Office [phone]   Kaydence Menard 10/24/2018, 1:31 PM

## 2018-10-24 NOTE — Discharge Instructions (Signed)
° °Dr. Frank Aluisio °Total [facility] Specialist °[facility] °[address] °[place], [place] [address] °[phone] ° °TOTAL KNEE REPLACEMENT POSTOPERATIVE DIRECTIONS ° °Knee Rehabilitation, Guidelines Following Surgery  °Results after knee surgery are often greatly improved when you follow the exercise, range of motion and muscle strengthening exercises prescribed by your doctor. Safety measures are also important to protect the knee from further injury. Any time any of these exercises cause you to have increased pain or swelling in your knee joint, decrease the amount until you are comfortable again and slowly increase them. If you have problems or questions, call your caregiver or physical therapist for advice.  ° °HOME CARE INSTRUCTIONS  °• Remove items at home which could result in a fall. This includes throw rugs or furniture in walking pathways.  °· ICE to the affected knee every three hours for 30 minutes at a time and then as needed for pain and swelling.  Continue to use ice on the knee for pain and swelling from surgery. You may notice swelling that will progress down to the foot and ankle.  This is normal after surgery.  Elevate the leg when you are not up walking on it.   °· Continue to use the breathing machine which will help keep your temperature down.  It is common for your temperature to cycle up and down following surgery, especially at night when you are not up moving around and exerting yourself.  The breathing machine keeps your lungs expanded and your temperature down. °· Do not place pillow under knee, focus on keeping the knee straight while resting ° °DIET °You may resume your previous home diet once your are discharged from the hospital. ° °DRESSING / WOUND CARE / SHOWERING °You may shower 3 days after surgery, but keep the wounds dry during showering.  You may use an occlusive plastic wrap (Press'n Seal for example), NO SOAKING/SUBMERGING IN THE BATHTUB.  If the bandage  gets wet, change with a clean dry gauze.  If the incision gets wet, pat the wound dry with a clean towel. °You may start showering once you are discharged home but do not submerge the incision under water. Just pat the incision dry and apply a dry gauze dressing on daily. °Change the surgical dressing daily and reapply a dry dressing each time. ° °ACTIVITY °Walk with your walker as instructed. °Use walker as long as suggested by your caregivers. °Avoid periods of inactivity such as sitting longer than an hour when not asleep. This helps prevent blood clots.  °You may resume a sexual relationship in one month or when given the OK by your doctor.  °You may return to work once you are cleared by your doctor.  °Do not drive a car for 6 weeks or until released by you surgeon.  °Do not drive while taking narcotics. ° °WEIGHT BEARING °Weight bearing as tolerated with assist device (walker, cane, etc) as directed, use it as long as suggested by your surgeon or therapist, typically at least 4-6 weeks. ° °POSTOPERATIVE CONSTIPATION PROTOCOL °Constipation - defined medically as fewer than three stools per week and severe constipation as less than one stool per week. ° °One of the most common issues patients have following surgery is constipation.  Even if you have a regular bowel pattern at home, your normal regimen is likely to be disrupted due to multiple reasons following surgery.  Combination of anesthesia, postoperative narcotics, change in appetite and fluid intake all can affect your bowels.    In order to avoid complications following surgery, here are some recommendations in order to help you during your recovery period. ° °Colace (docusate) - Pick up an over-the-counter form of Colace or another stool softener and take twice a day as long as you are requiring postoperative pain medications.  Take with a full glass of water daily.  If you experience loose stools or diarrhea, hold the colace until you stool forms back  up.  If your symptoms do not get better within 1 week or if they get worse, check with your doctor. ° °Dulcolax (bisacodyl) - Pick up over-the-counter and take as directed by the product packaging as needed to assist with the movement of your bowels.  Take with a full glass of water.  Use this product as needed if not relieved by Colace only.  ° °MiraLax (polyethylene glycol) - Pick up over-the-counter to have on hand.  MiraLax is a solution that will increase the amount of water in your bowels to assist with bowel movements.  Take as directed and can mix with a glass of water, juice, soda, coffee, or tea.  Take if you go more than two days without a movement. °Do not use MiraLax more than once per day. Call your doctor if you are still constipated or irregular after using this medication for 7 days in a row. ° °If you continue to have problems with postoperative constipation, please contact the office for further assistance and recommendations.  If you experience "the worst abdominal pain ever" or develop nausea or vomiting, please contact the office immediatly for further recommendations for treatment. ° °ITCHING ° If you experience itching with your medications, try taking only a single pain pill, or even half a pain pill at a time.  You can also use Benadryl over the counter for itching or also to help with sleep.  ° °TED HOSE STOCKINGS °Wear the elastic stockings on both legs for three weeks following surgery during the day but you may remove then at night for sleeping. ° °MEDICATIONS °See your medication summary on the “After Visit Summary” that the nursing staff will review with you prior to discharge.  You may have some home medications which will be placed on hold until you complete the course of blood thinner medication.  It is important for you to complete the blood thinner medication as prescribed by your surgeon.  Continue your approved medications as instructed at time of discharge. ° °PRECAUTIONS °If  you experience chest pain or shortness of breath - call 911 immediately for transfer to the hospital emergency department.  °If you develop a fever greater that 101 F, purulent drainage from wound, increased redness or drainage from wound, foul odor from the wound/dressing, or calf pain - CONTACT YOUR SURGEON.   °                                                °FOLLOW-UP APPOINTMENTS °Make sure you keep all of your appointments after your operation with your surgeon and caregivers. You should call the office at the above phone number and make an appointment for approximately two weeks after the date of your surgery or on the date instructed by your surgeon outlined in the "After Visit Summary". ° ° °RANGE OF MOTION AND STRENGTHENING EXERCISES  °Rehabilitation of the knee is important following a knee injury or   an operation. After just a few days of immobilization, the muscles of the thigh which control the knee become weakened and shrink (atrophy). Knee exercises are designed to build up the tone and strength of the thigh muscles and to improve knee motion. Often times heat used for twenty to thirty minutes before working out will loosen up your tissues and help with improving the range of motion but do not use heat for the first two weeks following surgery. These exercises can be done on a training (exercise) mat, on the floor, on a table or on a bed. Use what ever works the best and is most comfortable for you Knee exercises include:  °• Leg Lifts - While your knee is still immobilized in a splint or cast, you can do straight leg raises. Lift the leg to 60 degrees, hold for 3 sec, and slowly lower the leg. Repeat 10-20 times 2-3 times daily. Perform this exercise against resistance later as your knee gets better.  °• Quad and Hamstring Sets - Tighten up the muscle on the front of the thigh (Quad) and hold for 5-10 sec. Repeat this 10-20 times hourly. Hamstring sets are done by pushing the foot backward against an  object and holding for 5-10 sec. Repeat as with quad sets.  °· Leg Slides: Lying on your back, slowly slide your foot toward your buttocks, bending your knee up off the floor (only go as far as is comfortable). Then slowly slide your foot back down until your leg is flat on the floor again. °· Angel Wings: Lying on your back spread your legs to the side as far apart as you can without causing discomfort.  °A rehabilitation program following serious knee injuries can speed recovery and prevent re-injury in the future due to weakened muscles. Contact your doctor or a physical therapist for more information on knee rehabilitation.  ° °IF YOU ARE TRANSFERRED TO A SKILLED REHAB FACILITY °If the patient is transferred to a skilled rehab facility following release from the hospital, a list of the current medications will be sent to the facility for the patient to continue.  When discharged from the skilled rehab facility, please have the facility set up the patient's Home Health Physical Therapy prior to being released. Also, the skilled facility will be responsible for providing the patient with their medications at time of release from the facility to include their pain medication, the muscle relaxants, and their blood thinner medication. If the patient is still at the rehab facility at time of the two week follow up appointment, the skilled rehab facility will also need to assist the patient in arranging follow up appointment in our office and any transportation needs. ° °MAKE SURE YOU:  °• Understand these instructions.  °• Get help right away if you are not doing well or get worse.  ° ° °Pick up stool softner and laxative for home use following surgery while on pain medications. °Do not submerge incision under water. °Please use good hand washing techniques while changing dressing each day. °May shower starting three days after surgery. °Please use a clean towel to pat the incision dry following showers. °Continue to  use ice for pain and swelling after surgery. °Do not use any lotions or creams on the incision until instructed by your surgeon. ° °

## 2018-10-28 NOTE — Discharge Summary (Signed)
Physician Discharge Summary   Patient ID: Russell Gillespie MRN: [id number] DOB/AGE: 06/15/1961 57 y.o.  Admit date: 10/23/2018 Discharge date: 10/24/2018  Primary Diagnosis: Painful unstable left total knee arthroplasty   Admission Diagnoses:  Past Medical History:  Diagnosis Date  . Amblyopia of eye, left    reports it is "incorrectable"   . Chronic kidney disease    kidney stones  . Dysrhythmia    occasional irregular HR  . Family history of adverse reaction to anesthesia    Mother gets PONV  . Hemorrhoids   . Internal thrombosed hemorrhoid-left lateral 05/23/2012  . Irregular heart beat    reports " they told me i had a heart murmur since i was a kid in school" reports as nonsymptomatic   . Radiculopathy of lumbar region   . Tinnitus    bilateral   . Tuberculosis    tests positively on TB skin tests   Discharge Diagnoses:   Principal Problem:   Failed total knee arthroplasty ([place])  Estimated body mass index is 31.8 kg/m as calculated from the following:   Height as of this encounter: _0  (1.854 m).   Weight as of this encounter: 109.3 kg.  Procedure:  Procedure(s) (LRB): left knee polyethylene exchange with patella resurfacing (Left)   Consults: None  HPI: The patient is a 57 year old male who had a left total knee arthroplasty done in 2012.  He recently has developed recurrent effusions and instability in the knee.  He does not recall one specific injury which would have led to this. A bone scan was negative for loosening.  He presents now for polyethylene versus total knee revision.  Laboratory Data: Admission on 10/23/2018, Discharged on 10/24/2018  Component Date Value Ref Range Status  . WBC 10/24/2018 9.5  4.0 - 10.5 K/uL Final  . RBC 10/24/2018 4.11* 4.22 - 5.81 MIL/uL Final  . Hemoglobin 10/24/2018 13.0  13.0 - 17.0 g/dL Final  . HCT 10/24/2018 38.8* 39.0 - 52.0 % Final  . MCV 10/24/2018 94.4  80.0 - 100.0 fL Final  . MCH 10/24/2018 31.6   26.0 - 34.0 pg Final  . MCHC 10/24/2018 33.5  30.0 - 36.0 g/dL Final  . RDW 10/24/2018 12.1  11.5 - 15.5 % Final  . Platelets 10/24/2018 223  150 - 400 K/uL Final  . nRBC 10/24/2018 0.0  0.0 - 0.2 % Final   Performed at [facility], [place] [address] [place], [place] [address]  . Sodium 10/24/2018 137  135 - 145 mmol/L Final  . Potassium 10/24/2018 4.1  3.5 - 5.1 mmol/L Final  . Chloride 10/24/2018 104  98 - 111 mmol/L Final  . CO2 10/24/2018 25  22 - 32 mmol/L Final  . Glucose, Bld 10/24/2018 224* 70 - 99 mg/dL Final  . BUN 10/24/2018 13  6 - 20 mg/dL Final  . Creatinine, Ser 10/24/2018 0.79  0.61 - 1.24 mg/dL Final  . Calcium 10/24/2018 8.7* 8.9 - 10.3 mg/dL Final  . GFR calc non Af Amer 10/24/2018 >60  >60 mL/min Final  . GFR calc Af Amer 10/24/2018 >60  >60 mL/min Final   Comment: (NOTE) The eGFR has been calculated using the CKD EPI equation. This calculation has not been validated in all clinical situations. eGFR's persistently <60 mL/min signify possible Chronic Kidney Disease.   Georgiann Hahn gap 10/24/2018 8  5 - 15 Final   Performed at [facility], [place] [address], [place], [place] [address]  Hospital Outpatient  Visit on 10/17/2018  Component Date Value Ref Range Status  . aPTT 10/17/2018 27  24 - 36 seconds Final   Performed at [facility], [place] [address] [place], [place] [address]  . WBC 10/17/2018 4.9  4.0 - 10.5 K/uL Final  . RBC 10/17/2018 4.39  4.22 - 5.81 MIL/uL Final  . Hemoglobin 10/17/2018 13.7  13.0 - 17.0 g/dL Final  . HCT 10/17/2018 40.7  39.0 - 52.0 % Final  . MCV 10/17/2018 92.7  80.0 - 100.0 fL Final  . MCH 10/17/2018 31.2  26.0 - 34.0 pg Final  . MCHC 10/17/2018 33.7  30.0 - 36.0 g/dL Final  . RDW 10/17/2018 12.3  11.5 - 15.5 % Final  . Platelets 10/17/2018 238  150 - 400 K/uL Final  . nRBC 10/17/2018 0.0  0.0 - 0.2 % Final   Performed at [facility], [place] [address]  [place], [place] [address]  . Sodium 10/17/2018 140  135 - 145 mmol/L Final  . Potassium 10/17/2018 3.9  3.5 - 5.1 mmol/L Final  . Chloride 10/17/2018 104  98 - 111 mmol/L Final  . CO2 10/17/2018 29  22 - 32 mmol/L Final  . Glucose, Bld 10/17/2018 145* 70 - 99 mg/dL Final  . BUN 10/17/2018 12  6 - 20 mg/dL Final  . Creatinine, Ser 10/17/2018 0.83  0.61 - 1.24 mg/dL Final  . Calcium 10/17/2018 9.1  8.9 - 10.3 mg/dL Final  . Total Protein 10/17/2018 6.8  6.5 - 8.1 g/dL Final  . Albumin 10/17/2018 4.2  3.5 - 5.0 g/dL Final  . AST 10/17/2018 24  15 - 41 U/L Final  . ALT 10/17/2018 26  0 - 44 U/L Final  . Alkaline Phosphatase 10/17/2018 54  38 - 126 U/L Final  . Total Bilirubin 10/17/2018 0.8  0.3 - 1.2 mg/dL Final  . GFR calc non Af Amer 10/17/2018 >60  >60 mL/min Final  . GFR calc Af Amer 10/17/2018 >60  >60 mL/min Final   Comment: (NOTE) The eGFR has been calculated using the CKD EPI equation. This calculation has not been validated in all clinical situations. eGFR's persistently <60 mL/min signify possible Chronic Kidney Disease.   Georgiann Hahn gap 10/17/2018 7  5 - 15 Final   Performed at [facility], [place] [address], [place], [place] [address]  . Prothrombin Time 10/17/2018 13.0  11.4 - 15.2 seconds Final  . INR 10/17/2018 0.99   Final   Performed at [facility], [place] [address], [place], [place] [address]  . ABO/RH(D) 10/17/2018 A POS   Final  . Antibody Screen 10/17/2018 NEG   Final  . Sample Expiration 10/17/2018 10/26/2018   Final  . Extend sample reason 10/17/2018    Final                   Value:NO TRANSFUSIONS OR PREGNANCY IN THE PAST 3 MONTHS Performed at [facility], [place] [address] [place], [place] [address]   . MRSA, PCR 10/17/2018 NEGATIVE  NEGATIVE Final  . Staphylococcus aureus 10/17/2018 NEGATIVE  NEGATIVE Final   Comment: (NOTE) The Xpert SA Assay (FDA approved for NASAL specimens in patients 29 years of age and  older), is one component of a comprehensive surveillance program. It is not intended to diagnose infection nor to guide or monitor treatment. Performed at [facility], [place] [address], [place], [place] [address]      X-Rays:No results found.  EKG: Orders placed or performed during the hospital encounter of 04/25/16  . EKG 12-Lead  . EKG 12-Lead     Hospital Course: JAYSTON TREVINO is a 57 y.o. who was admitted to [facility]. They were brought to the operating room on 10/23/2018 and underwent Procedure(s): left knee polyethylene exchange with patella resurfacing.  Patient tolerated the procedure well and was later transferred to the recovery room and then to the orthopaedic floor for postoperative care. They were given PO and IV analgesics for pain control following their surgery. They were given 24 hours of postoperative antibiotics of  Anti-infectives (From admission, onward)   Start     Dose/Rate Route Frequency Ordered Stop   10/23/18 2300  ceFAZolin (ANCEF) IVPB 2g/100 mL premix     2 g 200 mL/hr over 30 Minutes Intravenous Every 6 hours 10/23/18 1958 10/24/18 0600   10/23/18 1345  ceFAZolin (ANCEF) IVPB 2g/100 mL premix     2 g 200 mL/hr over 30 Minutes Intravenous On call to O.R. 10/23/18 1328 10/23/18 1720     and started on DVT prophylaxis in the form of Aspirin.   PT and OT were ordered for total joint protocol. Discharge planning consulted to help with postop disposition and equipment needs.  Patient had a good night on the evening of surgery. They started to get up OOB with therapy on POD #1. Pt was seen during rounds and was ready to go home pending progress with therapy. Hemovac drain was pulled without difficulty. He worked with therapy on POD #1 and was meeting his goals. Pt was discharged to home later that day in stable condition.  Diet: Renal diet Activity: WBAT Follow-up: in 2 weeks with Dr. Wynelle Link Disposition: Home with  outpatient physical therapy at [facility] Discharged Condition: stable   Discharge Instructions    Call MD / Call 911   Complete by:  As directed    If you experience chest pain or shortness of breath, CALL 911 and be transported to the hospital emergency room.  If you develope a fever above 101 F, pus (white drainage) or increased drainage or redness at the wound, or calf pain, call your surgeon's office.   Change dressing   Complete by:  As directed    Change dressing on Friday, then change the dressing daily with sterile 4 x 4 inch gauze dressing and apply TED hose.   Constipation Prevention   Complete by:  As directed    Drink plenty of fluids.  Prune juice may be helpful.  You may use a stool softener, such as Colace (over the counter) 100 mg twice a day.  Use MiraLax (over the counter) for constipation as needed.   Diet - low sodium heart healthy   Complete by:  As directed    Discharge instructions   Complete by:  As directed    Dr. Gaynelle Arabian Total Joint Specialist [facility] [address] [place], [place] [address] [phone]  TOTAL KNEE REPLACEMENT POSTOPERATIVE DIRECTIONS  Knee Rehabilitation, Guidelines Following Surgery  Results after knee surgery are often greatly improved when you follow the exercise, range of motion and muscle strengthening exercises prescribed by your doctor. Safety measures are also important to protect the knee from further injury. Any time any of these exercises cause you to have increased pain or swelling in your knee joint, decrease the amount until you are comfortable again and slowly increase them. If you have problems or questions, call your caregiver or  physical therapist for advice.   HOME CARE INSTRUCTIONS  Remove items at home which could result in a fall. This includes throw rugs or furniture in walking pathways.  ICE to the affected knee every three hours for 30 minutes at a time and then as needed for pain and  swelling.  Continue to use ice on the knee for pain and swelling from surgery. You may notice swelling that will progress down to the foot and ankle.  This is normal after surgery.  Elevate the leg when you are not up walking on it.   Continue to use the breathing machine which will help keep your temperature down.  It is common for your temperature to cycle up and down following surgery, especially at night when you are not up moving around and exerting yourself.  The breathing machine keeps your lungs expanded and your temperature down. Do not place pillow under knee, focus on keeping the knee straight while resting   DIET You may resume your previous home diet once your are discharged from the hospital.  DRESSING / WOUND CARE / SHOWERING You may shower 3 days after surgery, but keep the wounds dry during showering.  You may use an occlusive plastic wrap (Press'n Seal for example), NO SOAKING/SUBMERGING IN THE BATHTUB.  If the bandage gets wet, change with a clean dry gauze.  If the incision gets wet, pat the wound dry with a clean towel. You may start showering once you are discharged home but do not submerge the incision under water. Just pat the incision dry and apply a dry gauze dressing on daily. Change the surgical dressing daily and reapply a dry dressing each time.  ACTIVITY Walk with your walker as instructed. Use walker as long as suggested by your caregivers. Avoid periods of inactivity such as sitting longer than an hour when not asleep. This helps prevent blood clots.  You may resume a sexual relationship in one month or when given the OK by your doctor.  You may return to work once you are cleared by your doctor.  Do not drive a car for 6 weeks or until released by you surgeon.  Do not drive while taking narcotics.  WEIGHT BEARING Weight bearing as tolerated with assist device (walker, cane, etc) as directed, use it as long as suggested by your surgeon or therapist, typically at  least 4-6 weeks.  POSTOPERATIVE CONSTIPATION PROTOCOL Constipation - defined medically as fewer than three stools per week and severe constipation as less than one stool per week.  One of the most common issues patients have following surgery is constipation.  Even if you have a regular bowel pattern at home, your normal regimen is likely to be disrupted due to multiple reasons following surgery.  Combination of anesthesia, postoperative narcotics, change in appetite and fluid intake all can affect your bowels.  In order to avoid complications following surgery, here are some recommendations in order to help you during your recovery period.  Colace (docusate) - Pick up an over-the-counter form of Colace or another stool softener and take twice a day as long as you are requiring postoperative pain medications.  Take with a full glass of water daily.  If you experience loose stools or diarrhea, hold the colace until you stool forms back up.  If your symptoms do not get better within 1 week or if they get worse, check with your doctor.  Dulcolax (bisacodyl) - Pick up over-the-counter and take as directed by the product  packaging as needed to assist with the movement of your bowels.  Take with a full glass of water.  Use this product as needed if not relieved by Colace only.   MiraLax (polyethylene glycol) - Pick up over-the-counter to have on hand.  MiraLax is a solution that will increase the amount of water in your bowels to assist with bowel movements.  Take as directed and can mix with a glass of water, juice, soda, coffee, or tea.  Take if you go more than two days without a movement. Do not use MiraLax more than once per day. Call your doctor if you are still constipated or irregular after using this medication for 7 days in a row.  If you continue to have problems with postoperative constipation, please contact the office for further assistance and recommendations.  If you experience "the worst  abdominal pain ever" or develop nausea or vomiting, please contact the office immediatly for further recommendations for treatment.  ITCHING  If you experience itching with your medications, try taking only a single pain pill, or even half a pain pill at a time.  You can also use Benadryl over the counter for itching or also to help with sleep.   TED HOSE STOCKINGS Wear the elastic stockings on both legs for three weeks following surgery during the day but you may remove then at night for sleeping.  MEDICATIONS See your medication summary on the "After Visit Summary" that the nursing staff will review with you prior to discharge.  You may have some home medications which will be placed on hold until you complete the course of blood thinner medication.  It is important for you to complete the blood thinner medication as prescribed by your surgeon.  Continue your approved medications as instructed at time of discharge.  PRECAUTIONS If you experience chest pain or shortness of breath - call 911 immediately for transfer to the hospital emergency department.  If you develop a fever greater that 101 F, purulent drainage from wound, increased redness or drainage from wound, foul odor from the wound/dressing, or calf pain - CONTACT YOUR SURGEON.                                                   FOLLOW-UP APPOINTMENTS Make sure you keep all of your appointments after your operation with your surgeon and caregivers. You should call the office at the above phone number and make an appointment for approximately two weeks after the date of your surgery or on the date instructed by your surgeon outlined in the "After Visit Summary".   RANGE OF MOTION AND STRENGTHENING EXERCISES  Rehabilitation of the knee is important following a knee injury or an operation. After just a few days of immobilization, the muscles of the thigh which control the knee become weakened and shrink (atrophy). Knee exercises are designed  to build up the tone and strength of the thigh muscles and to improve knee motion. Often times heat used for twenty to thirty minutes before working out will loosen up your tissues and help with improving the range of motion but do not use heat for the first two weeks following surgery. These exercises can be done on a training (exercise) mat, on the floor, on a table or on a bed. Use what ever works the best and is most  comfortable for you Knee exercises include:  Leg Lifts - While your knee is still immobilized in a splint or cast, you can do straight leg raises. Lift the leg to 60 degrees, hold for 3 sec, and slowly lower the leg. Repeat 10-20 times 2-3 times daily. Perform this exercise against resistance later as your knee gets better.  Quad and Hamstring Sets - Tighten up the muscle on the front of the thigh (Quad) and hold for 5-10 sec. Repeat this 10-20 times hourly. Hamstring sets are done by pushing the foot backward against an object and holding for 5-10 sec. Repeat as with quad sets.  Leg Slides: Lying on your back, slowly slide your foot toward your buttocks, bending your knee up off the floor (only go as far as is comfortable). Then slowly slide your foot back down until your leg is flat on the floor again. Angel Wings: Lying on your back spread your legs to the side as far apart as you can without causing discomfort.  A rehabilitation program following serious knee injuries can speed recovery and prevent re-injury in the future due to weakened muscles. Contact your doctor or a physical therapist for more information on knee rehabilitation.   IF YOU ARE TRANSFERRED TO A SKILLED REHAB FACILITY If the patient is transferred to a skilled rehab facility following release from the hospital, a list of the current medications will be sent to the facility for the patient to continue.  When discharged from the skilled rehab facility, please have the facility set up the patient's [place] prior to being released. Also, the skilled facility will be responsible for providing the patient with their medications at time of release from the facility to include their pain medication, the muscle relaxants, and their blood thinner medication. If the patient is still at the rehab facility at time of the two week follow up appointment, the skilled rehab facility will also need to assist the patient in arranging follow up appointment in our office and any transportation needs.  MAKE SURE YOU:  Understand these instructions.  Get help right away if you are not doing well or get worse.    Pick up stool softner and laxative for home use following surgery while on pain medications. Do not submerge incision under water. Please use good hand washing techniques while changing dressing each day. May shower starting three days after surgery. Please use a clean towel to pat the incision dry following showers. Continue to use ice for pain and swelling after surgery. Do not use any lotions or creams on the incision until instructed by your surgeon.   Do not put a pillow under the knee. Place it under the heel.   Complete by:  As directed    Driving restrictions   Complete by:  As directed    No driving for two weeks   TED hose   Complete by:  As directed    Use stockings (TED hose) for three weeks on both leg(s).  You may remove them at night for sleeping.   Weight bearing as tolerated   Complete by:  As directed      Allergies as of 10/24/2018      Reactions   Percocet [oxycodone-acetaminophen] Itching, Nausea Only   Zofran [ondansetron Hcl]    Doesn't work      Medication List    STOP taking these medications   ibuprofen 200 MG tablet Commonly known as:  ADVIL,MOTRIN   oxyCODONE 5  MG immediate release tablet Commonly known as:  Oxy IR/ROXICODONE     TAKE these medications   aspirin 325 MG EC tablet Take 1 tablet (325 mg total) by mouth 2 (two) times daily for 20 days.  Take one tablet (325 mg) Aspirin two times a day for three weeks following surgery. Then take one baby Aspirin (81 mg) once a day for three weeks. Then discontinue aspirin.   Fish Oil 1000 MG Caps Take 2,000 mg by mouth daily.   fluticasone 50 MCG/ACT nasal spray Commonly known as:  FLONASE Place 1 spray into both nostrils daily.   HYDROcodone-acetaminophen 5-325 MG tablet Commonly known as:  NORCO/VICODIN Take 1-2 tablets by mouth every 6 (six) hours as needed for moderate pain (pain score 4-6). What changed:    how much to take  reasons to take this   lisdexamfetamine 70 MG capsule Commonly known as:  VYVANSE Take 70 mg by mouth daily.   methocarbamol 500 MG tablet Commonly known as:  ROBAXIN Take 1 tablet (500 mg total) by mouth every 6 (six) hours as needed for muscle spasms.   rosuvastatin 5 MG tablet Commonly known as:  CRESTOR Take 5 mg by mouth daily.            Discharge Care Instructions  (From admission, onward)         Start     Ordered   10/24/18 0000  Weight bearing as tolerated     10/24/18 0716   10/24/18 0000  Change dressing    Comments:  Change dressing on Friday, then change the dressing daily with sterile 4 x 4 inch gauze dressing and apply TED hose.   10/24/18 0716         Follow-up Information    Gaynelle Arabian, MD. Schedule an appointment as soon as possible for a visit on 11/05/2018.   Specialty:  Orthopedic Surgery Contact information: [address] [place] [address] [phone]           Signed: Theresa Duty, PA-C Orthopedic Surgery 10/28/2018, 10:41 AM

## 2019-06-19 ENCOUNTER — Other Ambulatory Visit: Payer: Self-pay | Admitting: Family Medicine

## 2019-06-19 DIAGNOSIS — M5431 Sciatica, right side: Secondary | ICD-10-CM

## 2019-09-16 ENCOUNTER — Other Ambulatory Visit: Payer: Self-pay | Admitting: Neurosurgery

## 2019-09-19 ENCOUNTER — Other Ambulatory Visit: Payer: Self-pay | Admitting: Neurosurgery

## 2019-09-22 NOTE — Progress Notes (Signed)
[id number] [place], [place] [facility] [address] [facility] [address] [place] [address] Phone: [phone] Fax: [phone]  [id number] - [place], [place] - [address] AT [facility] [address] [place] [address] Phone: [phone] Fax: [phone]      Your procedure is scheduled on Thursday, October 15th.  Report to [facility] Main Entrance "A" at 1:20 P.M., and check in at the Admitting office.  Call this number if you have problems the morning of surgery:  [phone]  Call [phone] if you have any questions prior to your surgery date Monday-Friday 8am-4pm    Remember:  Do not eat or drink after midnight the night before your surgery     Take these medicines the morning of surgery with A SIP OF WATER   Rosuvastatin (Crestor)  7 days prior to surgery STOP taking any Aspirin (unless otherwise instructed by your surgeon), Aleve, Naproxen, Ibuprofen, Motrin, Advil, Goody's, BC's, all herbal medications, fish oil, and all vitamins.    The Morning of Surgery  Do not wear jewelry.  Do not wear lotions, powders, colognes, or deodorant  Men may shave face and neck.  Do not bring valuables to the hospital.  [facility] is not responsible for any belongings or valuables.  If you are a smoker, DO NOT Smoke 24 hours prior to surgery IF you wear a CPAP at night please bring your mask, tubing, and machine the morning of surgery   Remember that you must have someone to transport you home after your surgery, and remain with you for 24 hours if you are discharged the same day.   Contacts, glasses, hearing aids, dentures or bridgework may not be worn into surgery.    Leave your suitcase in the car.  After surgery it may be brought to your room.  For patients admitted to the hospital, discharge time will be determined by your treatment team.  Patients discharged the day of  surgery will not be allowed to drive home.    Special instructions:   [facility]- Preparing For Surgery  Before surgery, you can play an important role. Because skin is not sterile, your skin needs to be as free of germs as possible. You can reduce the number of germs on your skin by washing with CHG (chlorahexidine gluconate) Soap before surgery.  CHG is an antiseptic cleaner which kills germs and bonds with the skin to continue killing germs even after washing.    Oral Hygiene is also important to reduce your risk of infection.  Remember - BRUSH YOUR TEETH THE MORNING OF SURGERY WITH YOUR REGULAR TOOTHPASTE  Please do not use if you have an allergy to CHG or antibacterial soaps. If your skin becomes reddened/irritated stop using the CHG.  Do not shave (including legs and underarms) for at least 48 hours prior to first CHG shower. It is OK to shave your face.  Please follow these instructions carefully.   1. Shower the NIGHT BEFORE SURGERY and the MORNING OF SURGERY with CHG Soap.   2. If you chose to wash your hair, wash your hair first as usual with your normal shampoo.  3. After you shampoo, rinse your hair and body thoroughly to remove the shampoo.  4. Use CHG as you would any other liquid soap. You can apply CHG directly to the skin and wash gently with a scrungie or a clean washcloth.   5. Apply  the CHG Soap to your body ONLY FROM THE NECK DOWN.  Do not use on open wounds or open sores. Avoid contact with your eyes, ears, mouth and genitals (private parts). Wash Face and genitals (private parts)  with your normal soap.   6. Wash thoroughly, paying special attention to the area where your surgery will be performed.  7. Thoroughly rinse your body with warm water from the neck down.  8. DO NOT shower/wash with your normal soap after using and rinsing off the CHG Soap.  9. Pat yourself dry with a CLEAN TOWEL.  10. Wear CLEAN PAJAMAS to bed the night before surgery, wear  comfortable clothes the morning of surgery  11. Place CLEAN SHEETS on your bed the night of your first shower and DO NOT SLEEP WITH PETS.    Day of Surgery:  Do not apply any deodorants/lotions. Please shower the morning of surgery with the CHG soap  Please wear clean clothes to the hospital/surgery center.   Remember to brush your teeth WITH YOUR REGULAR TOOTHPASTE.   Please read over the following fact sheets that you were given.

## 2019-09-23 ENCOUNTER — Encounter (HOSPITAL_COMMUNITY)
Admission: RE | Admit: 2019-09-23 | Discharge: 2019-09-23 | Disposition: A | Discharge status: Home / Self Care | Payer: 59 | Source: Ambulatory Visit | Attending: Neurosurgery | Admitting: Neurosurgery

## 2019-09-23 ENCOUNTER — Other Ambulatory Visit: Payer: Self-pay

## 2019-09-23 ENCOUNTER — Encounter (HOSPITAL_COMMUNITY): Payer: Self-pay

## 2019-09-23 ENCOUNTER — Other Ambulatory Visit (HOSPITAL_COMMUNITY)
Admission: RE | Admit: 2019-09-23 | Discharge: 2019-09-23 | Disposition: A | Discharge status: Home / Self Care | Payer: 59 | Source: Ambulatory Visit | Attending: Neurosurgery | Admitting: Neurosurgery

## 2019-09-23 DIAGNOSIS — Z01812 Encounter for preprocedural laboratory examination: Secondary | ICD-10-CM | POA: Insufficient documentation

## 2019-09-23 DIAGNOSIS — Z20828 Contact with and (suspected) exposure to other viral communicable diseases: Secondary | ICD-10-CM | POA: Diagnosis not present

## 2019-09-23 HISTORY — DX: Prediabetes: R73.03

## 2019-09-23 HISTORY — DX: Unspecified osteoarthritis, unspecified site: M19.90

## 2019-09-23 HISTORY — DX: Headache, unspecified: R51.9

## 2019-09-23 LAB — SURGICAL PCR SCREEN
MRSA, PCR: NEGATIVE
Staphylococcus aureus: NEGATIVE

## 2019-09-23 LAB — BASIC METABOLIC PANEL
Anion gap: 9 (ref 5–15)
BUN: 9 mg/dL (ref 6–20)
CO2: 27 mmol/L (ref 22–32)
Calcium: 9.8 mg/dL (ref 8.9–10.3)
Chloride: 98 mmol/L (ref 98–111)
Creatinine, Ser: 0.79 mg/dL (ref 0.61–1.24)
GFR calc Af Amer: >60 mL/min (ref >60)
GFR calc non Af Amer: >60 mL/min (ref >60)
Glucose, Bld: 331 mg/dL — ABNORMAL HIGH (ref 70–99)
Potassium: 4.2 mmol/L (ref 3.5–5.1)
Sodium: 134 mmol/L — ABNORMAL LOW (ref 135–145)

## 2019-09-23 LAB — CBC
HCT: 43.4 % (ref 39.0–52.0)
Hemoglobin: 15 g/dL (ref 13.0–17.0)
MCH: 31.4 pg (ref 26.0–34.0)
MCHC: 34.6 g/dL (ref 30.0–36.0)
MCV: 90.8 fL (ref 80.0–100.0)
Platelets: 245 10*3/uL (ref 150–400)
RBC: 4.78 MIL/uL (ref 4.22–5.81)
RDW: 12 % (ref 11.5–15.5)
WBC: 5.2 10*3/uL (ref 4.0–10.5)
nRBC: 0 % (ref 0.0–0.2)

## 2019-09-23 LAB — SARS CORONAVIRUS 2 (TAT 6-24 HRS): SARS Coronavirus 2: NEGATIVE

## 2019-09-23 NOTE — Progress Notes (Signed)
PCP - [place] Cardiologist -   PPM/ICD - n/a Device Orders -  Rep Notified -   Chest x-ray -  EKG - requesting from Dr. Moreen Fowler Stress Test -  ECHO -  Cardiac Cath -   Sleep Study - n/a CPAP -   Fasting Blood Sugar -  Checks Blood Sugar _____ times a day  Blood Thinner Instructions:n/a Aspirin Instructions:  ERAS Protcol -n/a PRE-SURGERY Ensure or G2-   COVID TEST- today   Anesthesia review: lab - attention to glucose- pt. Reports that he is "borderline"   Patient denies shortness of breath, fever, cough and chest pain at PAT appointment   All instructions explained to the patient, with a verbal understanding of the material. Patient agrees to go over the instructions while at home for a better understanding. Patient also instructed to self quarantine after being tested for COVID-19. The opportunity to ask questions was provided.

## 2019-09-24 DIAGNOSIS — Y838 Other surgical procedures as the cause of abnormal reaction of the patient, or of later complication, without mention of misadventure at the time of the procedure: Secondary | ICD-10-CM | POA: Diagnosis not present

## 2019-09-24 DIAGNOSIS — G9782 Other postprocedural complications and disorders of nervous system: Secondary | ICD-10-CM | POA: Diagnosis present

## 2019-09-24 DIAGNOSIS — Z7984 Long term (current) use of oral hypoglycemic drugs: Secondary | ICD-10-CM | POA: Diagnosis not present

## 2019-09-24 DIAGNOSIS — Z79899 Other long term (current) drug therapy: Secondary | ICD-10-CM | POA: Diagnosis not present

## 2019-09-24 DIAGNOSIS — Z87891 Personal history of nicotine dependence: Secondary | ICD-10-CM | POA: Diagnosis not present

## 2019-09-24 DIAGNOSIS — G96198 Other disorders of meninges, not elsewhere classified: Secondary | ICD-10-CM | POA: Diagnosis not present

## 2019-09-24 DIAGNOSIS — Z96641 Presence of right artificial hip joint: Secondary | ICD-10-CM | POA: Diagnosis not present

## 2019-09-24 DIAGNOSIS — Z96652 Presence of left artificial knee joint: Secondary | ICD-10-CM | POA: Diagnosis not present

## 2019-09-24 LAB — HEMOGLOBIN A1C
Hgb A1c MFr Bld: 8.4 % — ABNORMAL HIGH (ref 4.8–5.6)
Mean Plasma Glucose: 194.38 mg/dL

## 2019-09-24 NOTE — Anesthesia Preprocedure Evaluation (Addendum)
Anesthesia Evaluation  Patient identified by MRN, date of birth, ID band Patient awake    Reviewed: Allergy & Precautions, H&P , NPO status , Patient's Chart, lab work & pertinent test results  Airway Mallampati: III  TM Distance: >3 FB Neck ROM: Full    Dental no notable dental hx. (+) Teeth Intact, Dental Advisory Given   Pulmonary neg pulmonary ROS, former smoker,    Pulmonary exam normal breath sounds clear to auscultation       Cardiovascular + dysrhythmias  Rhythm:Regular Rate:Normal     Neuro/Psych  Headaches, negative psych ROS   GI/Hepatic negative GI ROS, Neg liver ROS,   Endo/Other  negative endocrine ROS  Renal/GU Renal disease  negative genitourinary   Musculoskeletal  (+) Arthritis , Osteoarthritis,    Abdominal   Peds  Hematology negative hematology ROS (+)   Anesthesia Other Findings   Reproductive/Obstetrics negative OB ROS                           Anesthesia Physical Anesthesia Plan  ASA: II  Anesthesia Plan: General   Post-op Pain Management:    Induction: Intravenous  PONV Risk Score and Plan: 3 and Ondansetron, Dexamethasone and Midazolam  Airway Management Planned: Oral ETT  Additional Equipment:   Intra-op Plan:   Post-operative Plan: Extubation in OR  Informed Consent: I have reviewed the patients History and Physical, chart, labs and discussed the procedure including the risks, benefits and alternatives for the proposed anesthesia with the patient or authorized representative who has indicated his/her understanding and acceptance.     Dental advisory given  Plan Discussed with: CRNA  Anesthesia Plan Comments: (Poorly controlled DMII. BG on preop labs 331, A1c 8.4. Per PCP records from 07/11/19  "A1c has climbed up to 8.5.  Prior level on 10/11/2018 was 6.5.  Current level may be higher due to recent steroid usage.  Please work on diet modification  and weight loss.  Recheck lab only serum A1c in 3 months.  If A1c is not significantly better (target is 7 or less), we will plan to start medication to help."  Pt is not currently on any antiDM medications. Labs forwarded to pt's PCP. Called and discussed with Dr. Arnoldo Morale' surgery scheduler. Pt advised uncontrolled BG on DOS could be cause for cancellation. Dr. Arnoldo Morale advised to proceed as planned pending DOS BG control.  EKG 10/11/18 (copy on pt chart): Sinus rhythm with occasional PACs. Rate 60.)     Anesthesia Quick Evaluation

## 2019-09-24 NOTE — Progress Notes (Signed)
Anesthesia Chart Review: Poorly controlled DMII. BG on preop labs 331, A1c 8.4. Per PCP records from 07/11/19  "A1c has climbed up to 8.5.  Prior level on 10/11/2018 was 6.5.  Current level may be higher due to recent steroid usage.  Please work on diet modification and weight loss.  Recheck lab only serum A1c in 3 months.  If A1c is not significantly better (target is 7 or less), we will plan to start medication to help."  Pt is not currently on any antiDM medications. Labs forwarded to pt's PCP. Called and discussed with Dr. Arnoldo Morale' surgery scheduler. Pt advised uncontrolled BG on DOS could be cause for cancellation. Dr. Arnoldo Morale advised to proceed as planned pending DOS BG control.  EKG 10/11/18 (copy on pt chart): Sinus rhythm with occasional PACs. Rate 60.   Wynonia Musty [facility] Short Stay Center/Anesthesiology Phone [phone] 09/24/2019 1:14 PM

## 2019-09-25 ENCOUNTER — Other Ambulatory Visit: Payer: Self-pay

## 2019-09-25 ENCOUNTER — Ambulatory Visit (HOSPITAL_COMMUNITY): Payer: 59 | Admitting: Physician Assistant

## 2019-09-25 ENCOUNTER — Encounter (HOSPITAL_COMMUNITY): Payer: Self-pay

## 2019-09-25 ENCOUNTER — Encounter (HOSPITAL_COMMUNITY)
Admission: RE | Disposition: A | Discharge status: Home / Self Care | Payer: Self-pay | Source: Home / Self Care | Attending: Neurosurgery

## 2019-09-25 ENCOUNTER — Observation Stay (HOSPITAL_COMMUNITY)
Admission: RE | Admit: 2019-09-25 | Discharge: 2019-09-26 | Disposition: A | Discharge status: Home / Self Care | Payer: 59 | Attending: Neurosurgery | Admitting: Neurosurgery

## 2019-09-25 DIAGNOSIS — G9782 Other postprocedural complications and disorders of nervous system: Principal | ICD-10-CM | POA: Diagnosis present

## 2019-09-25 DIAGNOSIS — Y838 Other surgical procedures as the cause of abnormal reaction of the patient, or of later complication, without mention of misadventure at the time of the procedure: Secondary | ICD-10-CM | POA: Insufficient documentation

## 2019-09-25 DIAGNOSIS — Z96641 Presence of right artificial hip joint: Secondary | ICD-10-CM | POA: Insufficient documentation

## 2019-09-25 DIAGNOSIS — Z79899 Other long term (current) drug therapy: Secondary | ICD-10-CM | POA: Insufficient documentation

## 2019-09-25 DIAGNOSIS — Z7984 Long term (current) use of oral hypoglycemic drugs: Secondary | ICD-10-CM | POA: Insufficient documentation

## 2019-09-25 DIAGNOSIS — Z96652 Presence of left artificial knee joint: Secondary | ICD-10-CM | POA: Insufficient documentation

## 2019-09-25 DIAGNOSIS — G96198 Other disorders of meninges, not elsewhere classified: Secondary | ICD-10-CM | POA: Insufficient documentation

## 2019-09-25 DIAGNOSIS — Z87891 Personal history of nicotine dependence: Secondary | ICD-10-CM | POA: Insufficient documentation

## 2019-09-25 HISTORY — PX: LUMBAR WOUND DEBRIDEMENT: SHX1988

## 2019-09-25 LAB — GLUCOSE, CAPILLARY
Glucose-Capillary: 175 mg/dL — ABNORMAL HIGH (ref 70–99)
Glucose-Capillary: 130 mg/dL — ABNORMAL HIGH (ref 70–99)
Glucose-Capillary: 370 mg/dL — ABNORMAL HIGH (ref 70–99)

## 2019-09-25 SURGERY — LUMBAR WOUND DEBRIDEMENT
Anesthesia: General | Site: Back

## 2019-09-25 MED ORDER — HYDROMORPHONE HCL 1 MG/ML IJ SOLN
0.2500 mg | INTRAMUSCULAR | Status: DC | PRN
  Administered 2019-09-25 (×2): 0.5 mg via INTRAVENOUS

## 2019-09-25 MED ORDER — DOCUSATE SODIUM 100 MG PO CAPS
100.0000 mg | ORAL_CAPSULE | Freq: Two times a day (BID) | ORAL | Status: DC
  Administered 2019-09-25 – 2019-09-26 (×2): 100 mg via ORAL
  Filled 2019-09-25 (×2): qty 1

## 2019-09-25 MED ORDER — CHLORHEXIDINE GLUCONATE CLOTH 2 % EX PADS: 6.0000 | MEDICATED_PAD | Freq: Once | CUTANEOUS | Status: DC

## 2019-09-25 MED ORDER — CEFAZOLIN SODIUM-DEXTROSE 2-4 GM/100ML-% IV SOLN
INTRAVENOUS | Status: AC
  Filled 2019-09-25: qty 100

## 2019-09-25 MED ORDER — CYCLOBENZAPRINE HCL 10 MG PO TABS
10.0000 mg | ORAL_TABLET | Freq: Three times a day (TID) | ORAL | Status: DC | PRN
  Administered 2019-09-25: 10 mg via ORAL
  Filled 2019-09-25: qty 1

## 2019-09-25 MED ORDER — DEXAMETHASONE SODIUM PHOSPHATE 10 MG/ML IJ SOLN
INTRAMUSCULAR | Status: AC
  Filled 2019-09-25: qty 1

## 2019-09-25 MED ORDER — ONDANSETRON HCL 4 MG PO TABS: 4.0000 mg | ORAL_TABLET | Freq: Four times a day (QID) | ORAL | Status: DC | PRN

## 2019-09-25 MED ORDER — LIDOCAINE 2% (20 MG/ML) 5 ML SYRINGE
INTRAMUSCULAR | Status: DC | PRN
  Administered 2019-09-25: 100 mg via INTRAVENOUS

## 2019-09-25 MED ORDER — BACITRACIN ZINC 500 UNIT/GM EX OINT
TOPICAL_OINTMENT | CUTANEOUS | Status: AC
  Filled 2019-09-25: qty 28.35

## 2019-09-25 MED ORDER — PROPOFOL 10 MG/ML IV BOLUS
INTRAVENOUS | Status: DC | PRN
  Administered 2019-09-25: 150 mg via INTRAVENOUS

## 2019-09-25 MED ORDER — HEMOSTATIC AGENTS (NO CHARGE) OPTIME
TOPICAL | Status: DC | PRN
  Administered 2019-09-25 (×2): 1 via TOPICAL

## 2019-09-25 MED ORDER — MORPHINE SULFATE (PF) 4 MG/ML IV SOLN: 4.0000 mg | INTRAVENOUS | Status: DC | PRN

## 2019-09-25 MED ORDER — ACETAMINOPHEN 500 MG PO TABS: 1000.0000 mg | ORAL_TABLET | Freq: Once | ORAL | Status: DC

## 2019-09-25 MED ORDER — INSULIN ASPART 100 UNIT/ML ~~LOC~~ SOLN: 0.0000 [IU] | SUBCUTANEOUS | Status: DC

## 2019-09-25 MED ORDER — SODIUM CHLORIDE 0.9% FLUSH: 3.0000 mL | INTRAVENOUS | Status: DC | PRN

## 2019-09-25 MED ORDER — DEXAMETHASONE SODIUM PHOSPHATE 10 MG/ML IJ SOLN
INTRAMUSCULAR | Status: DC | PRN
  Administered 2019-09-25: 4 mg via INTRAVENOUS

## 2019-09-25 MED ORDER — PROPOFOL 10 MG/ML IV BOLUS
INTRAVENOUS | Status: AC
  Filled 2019-09-25: qty 20

## 2019-09-25 MED ORDER — AMPHETAMINE-DEXTROAMPHET ER 30 MG PO CP24: 30.0000 mg | ORAL_CAPSULE | Freq: Every day | ORAL | Status: DC

## 2019-09-25 MED ORDER — HYDROMORPHONE HCL 2 MG PO TABS: 4.0000 mg | ORAL_TABLET | ORAL | Status: DC | PRN

## 2019-09-25 MED ORDER — ACETAMINOPHEN 500 MG PO TABS: 1000.0000 mg | ORAL_TABLET | Freq: Four times a day (QID) | ORAL | Status: DC

## 2019-09-25 MED ORDER — CEFAZOLIN SODIUM-DEXTROSE 2-4 GM/100ML-% IV SOLN
2.0000 g | INTRAVENOUS | Status: AC
  Administered 2019-09-25: 2 g via INTRAVENOUS

## 2019-09-25 MED ORDER — PHENOL 1.4 % MT LIQD: 1.0000 | OROMUCOSAL | Status: DC | PRN

## 2019-09-25 MED ORDER — FENTANYL CITRATE (PF) 250 MCG/5ML IJ SOLN
INTRAMUSCULAR | Status: AC
  Filled 2019-09-25: qty 5

## 2019-09-25 MED ORDER — PHENYLEPHRINE 40 MCG/ML (10ML) SYRINGE FOR IV PUSH (FOR BLOOD PRESSURE SUPPORT)
PREFILLED_SYRINGE | INTRAVENOUS | Status: AC
  Filled 2019-09-25: qty 10

## 2019-09-25 MED ORDER — HYDROMORPHONE HCL 1 MG/ML IJ SOLN
INTRAMUSCULAR | Status: AC
  Filled 2019-09-25: qty 1

## 2019-09-25 MED ORDER — MENTHOL 3 MG MT LOZG: 1.0000 | LOZENGE | OROMUCOSAL | Status: DC | PRN

## 2019-09-25 MED ORDER — ONDANSETRON HCL 4 MG/2ML IJ SOLN
INTRAMUSCULAR | Status: AC
  Filled 2019-09-25: qty 2

## 2019-09-25 MED ORDER — THROMBIN 20000 UNITS EX SOLR
CUTANEOUS | Status: AC
  Filled 2019-09-25: qty 20000

## 2019-09-25 MED ORDER — ROCURONIUM BROMIDE 10 MG/ML (PF) SYRINGE
PREFILLED_SYRINGE | INTRAVENOUS | Status: AC
  Filled 2019-09-25: qty 10

## 2019-09-25 MED ORDER — ROCURONIUM BROMIDE 10 MG/ML (PF) SYRINGE
PREFILLED_SYRINGE | INTRAVENOUS | Status: DC | PRN
  Administered 2019-09-25: 100 mg via INTRAVENOUS

## 2019-09-25 MED ORDER — MIDAZOLAM HCL 2 MG/2ML IJ SOLN
INTRAMUSCULAR | Status: DC | PRN
  Administered 2019-09-25: 2 mg via INTRAVENOUS

## 2019-09-25 MED ORDER — BUPIVACAINE-EPINEPHRINE (PF) 0.5% -1:200000 IJ SOLN
INTRAMUSCULAR | Status: AC
  Filled 2019-09-25: qty 30

## 2019-09-25 MED ORDER — LACTATED RINGERS IV SOLN
INTRAVENOUS | Status: DC
  Administered 2019-09-25 (×2): via INTRAVENOUS

## 2019-09-25 MED ORDER — ACETAMINOPHEN 650 MG RE SUPP: 650.0000 mg | RECTAL | Status: DC | PRN

## 2019-09-25 MED ORDER — INSULIN ASPART 100 UNIT/ML ~~LOC~~ SOLN
0.0000 [IU] | Freq: Every day | SUBCUTANEOUS | Status: DC
  Administered 2019-09-25: 5 [IU] via SUBCUTANEOUS

## 2019-09-25 MED ORDER — THROMBIN 5000 UNITS EX SOLR
CUTANEOUS | Status: AC
  Filled 2019-09-25: qty 15000

## 2019-09-25 MED ORDER — CEFAZOLIN SODIUM-DEXTROSE 2-4 GM/100ML-% IV SOLN
2.0000 g | Freq: Three times a day (TID) | INTRAVENOUS | Status: AC
  Administered 2019-09-25 – 2019-09-26 (×2): 2 g via INTRAVENOUS
  Filled 2019-09-25 (×2): qty 100

## 2019-09-25 MED ORDER — MIDAZOLAM HCL 2 MG/2ML IJ SOLN
INTRAMUSCULAR | Status: AC
  Filled 2019-09-25: qty 2

## 2019-09-25 MED ORDER — SODIUM CHLORIDE 0.9 % IV SOLN
INTRAVENOUS | Status: DC | PRN
  Administered 2019-09-25: 18:00:00

## 2019-09-25 MED ORDER — BISACODYL 10 MG RE SUPP: 10.0000 mg | Freq: Every day | RECTAL | Status: DC | PRN

## 2019-09-25 MED ORDER — SODIUM CHLORIDE 0.9% FLUSH: 3.0000 mL | Freq: Two times a day (BID) | INTRAVENOUS | Status: DC

## 2019-09-25 MED ORDER — SODIUM CHLORIDE 0.9 % IV SOLN: 250.0000 mL | INTRAVENOUS | Status: DC

## 2019-09-25 MED ORDER — ACETAMINOPHEN 325 MG PO TABS: 650.0000 mg | ORAL_TABLET | ORAL | Status: DC | PRN

## 2019-09-25 MED ORDER — PHENYLEPHRINE 40 MCG/ML (10ML) SYRINGE FOR IV PUSH (FOR BLOOD PRESSURE SUPPORT)
PREFILLED_SYRINGE | INTRAVENOUS | Status: DC | PRN
  Administered 2019-09-25 (×2): 80 ug via INTRAVENOUS
  Administered 2019-09-25: 120 ug via INTRAVENOUS
  Administered 2019-09-25: 80 ug via INTRAVENOUS
  Administered 2019-09-25: 120 ug via INTRAVENOUS
  Administered 2019-09-25: 80 ug via INTRAVENOUS

## 2019-09-25 MED ORDER — THROMBIN 5000 UNITS EX SOLR
OROMUCOSAL | Status: DC | PRN
  Administered 2019-09-25: 18:00:00 via TOPICAL

## 2019-09-25 MED ORDER — BACITRACIN ZINC 500 UNIT/GM EX OINT
TOPICAL_OINTMENT | CUTANEOUS | Status: DC | PRN
  Administered 2019-09-25: 1 via TOPICAL

## 2019-09-25 MED ORDER — ONDANSETRON HCL 4 MG/2ML IJ SOLN: 4.0000 mg | Freq: Four times a day (QID) | INTRAMUSCULAR | Status: DC | PRN

## 2019-09-25 MED ORDER — LIDOCAINE 2% (20 MG/ML) 5 ML SYRINGE
INTRAMUSCULAR | Status: AC
  Filled 2019-09-25: qty 5

## 2019-09-25 MED ORDER — INSULIN ASPART 100 UNIT/ML ~~LOC~~ SOLN
0.0000 [IU] | Freq: Three times a day (TID) | SUBCUTANEOUS | Status: DC
  Administered 2019-09-26: 3 [IU] via SUBCUTANEOUS

## 2019-09-25 MED ORDER — IBUPROFEN 200 MG PO TABS: 400.0000 mg | ORAL_TABLET | Freq: Four times a day (QID) | ORAL | Status: DC | PRN

## 2019-09-25 MED ORDER — SUGAMMADEX SODIUM 500 MG/5ML IV SOLN
INTRAVENOUS | Status: AC
  Filled 2019-09-25: qty 5

## 2019-09-25 MED ORDER — ONDANSETRON HCL 4 MG/2ML IJ SOLN
INTRAMUSCULAR | Status: DC | PRN
  Administered 2019-09-25: 4 mg via INTRAVENOUS

## 2019-09-25 MED ORDER — METFORMIN HCL 500 MG PO TABS
500.0000 mg | ORAL_TABLET | Freq: Every day | ORAL | Status: DC
  Administered 2019-09-26: 500 mg via ORAL

## 2019-09-25 MED ORDER — OXYCODONE-ACETAMINOPHEN 5-325 MG PO TABS
1.0000 | ORAL_TABLET | ORAL | Status: DC | PRN
  Administered 2019-09-25 – 2019-09-26 (×2): 2 via ORAL
  Filled 2019-09-25 (×2): qty 2

## 2019-09-25 MED ORDER — FENTANYL CITRATE (PF) 250 MCG/5ML IJ SOLN
INTRAMUSCULAR | Status: DC | PRN
  Administered 2019-09-25: 150 ug via INTRAVENOUS
  Administered 2019-09-25 (×2): 50 ug via INTRAVENOUS

## 2019-09-25 MED ORDER — ROSUVASTATIN CALCIUM 5 MG PO TABS
5.0000 mg | ORAL_TABLET | Freq: Every day | ORAL | Status: DC
  Administered 2019-09-26: 5 mg via ORAL
  Filled 2019-09-25: qty 1

## 2019-09-25 MED ORDER — 0.9 % SODIUM CHLORIDE (POUR BTL) OPTIME
TOPICAL | Status: DC | PRN
  Administered 2019-09-25: 1000 mL

## 2019-09-25 MED ORDER — SUGAMMADEX SODIUM 200 MG/2ML IV SOLN
INTRAVENOUS | Status: DC | PRN
  Administered 2019-09-25: 400 mg via INTRAVENOUS

## 2019-09-25 SURGICAL SUPPLY — 46 items
BAG DECANTER FOR FLEXI CONT (MISCELLANEOUS) ×3 IMPLANT
BENZOIN TINCTURE PRP APPL 2/3 (GAUZE/BANDAGES/DRESSINGS) ×3 IMPLANT
BLADE CLIPPER SURG (BLADE) ×3 IMPLANT
CANISTER SUCT 3000ML PPV (MISCELLANEOUS) ×3 IMPLANT
CARTRIDGE OIL MAESTRO DRILL (MISCELLANEOUS) ×1 IMPLANT
CLOSURE WOUND 1/2 X4 (GAUZE/BANDAGES/DRESSINGS) ×1
COVER WAND RF STERILE (DRAPES) IMPLANT
DIFFUSER DRILL AIR PNEUMATIC (MISCELLANEOUS) ×3 IMPLANT
DRAPE LAPAROTOMY 100X72X124 (DRAPES) ×3 IMPLANT
DRAPE POUCH INSTRU U-SHP 10X18 (DRAPES) IMPLANT
DRAPE SURG 17X23 STRL (DRAPES) ×12 IMPLANT
DRSG OPSITE POSTOP 4X6 (GAUZE/BANDAGES/DRESSINGS) ×3 IMPLANT
ELECT BLADE 4.0 EZ CLEAN MEGAD (MISCELLANEOUS) ×3
ELECT REM PT RETURN 9FT ADLT (ELECTROSURGICAL) ×3
ELECTRODE BLDE 4.0 EZ CLN MEGD (MISCELLANEOUS) ×1 IMPLANT
ELECTRODE REM PT RTRN 9FT ADLT (ELECTROSURGICAL) ×1 IMPLANT
GAUZE 4X4 16PLY RFD (DISPOSABLE) IMPLANT
GAUZE SPONGE 4X4 12PLY STRL (GAUZE/BANDAGES/DRESSINGS) IMPLANT
GLOVE BIO SURGEON STRL SZ8 (GLOVE) ×3 IMPLANT
GLOVE BIO SURGEON STRL SZ8.5 (GLOVE) ×3 IMPLANT
GLOVE BIOGEL PI IND STRL 8 (GLOVE) ×4 IMPLANT
GLOVE BIOGEL PI INDICATOR 8 (GLOVE) ×8
GLOVE EXAM NITRILE XL STR (GLOVE) IMPLANT
GOWN STRL REUS W/ TWL LRG LVL3 (GOWN DISPOSABLE) IMPLANT
GOWN STRL REUS W/ TWL XL LVL3 (GOWN DISPOSABLE) ×1 IMPLANT
GOWN STRL REUS W/TWL LRG LVL3 (GOWN DISPOSABLE)
GOWN STRL REUS W/TWL XL LVL3 (GOWN DISPOSABLE) ×2
KIT BASIN OR (CUSTOM PROCEDURE TRAY) ×3 IMPLANT
KIT TURNOVER KIT B (KITS) ×3 IMPLANT
NEEDLE HYPO 22GX1.5 SAFETY (NEEDLE) ×3 IMPLANT
NS IRRIG 1000ML POUR BTL (IV SOLUTION) ×3 IMPLANT
OIL CARTRIDGE MAESTRO DRILL (MISCELLANEOUS) ×3
PACK LAMINECTOMY NEURO (CUSTOM PROCEDURE TRAY) ×3 IMPLANT
PAD ARMBOARD 7.5X6 YLW CONV (MISCELLANEOUS) ×9 IMPLANT
SPONGE SURGIFOAM ABS GEL SZ50 (HEMOSTASIS) ×3 IMPLANT
STRIP CLOSURE SKIN 1/2X4 (GAUZE/BANDAGES/DRESSINGS) ×2 IMPLANT
SUT ETHILON 2 0 PSLX (SUTURE) IMPLANT
SUT PROLENE 6 0 BV (SUTURE) ×6 IMPLANT
SUT VIC AB 1 CT1 18XBRD ANBCTR (SUTURE) ×1 IMPLANT
SUT VIC AB 1 CT1 8-18 (SUTURE) ×2
SUT VIC AB 2-0 CP2 18 (SUTURE) ×3 IMPLANT
SWAB COLLECTION DEVICE MRSA (MISCELLANEOUS) IMPLANT
SWAB CULTURE ESWAB REG 1ML (MISCELLANEOUS) IMPLANT
TOWEL GREEN STERILE (TOWEL DISPOSABLE) ×3 IMPLANT
TOWEL GREEN STERILE FF (TOWEL DISPOSABLE) ×3 IMPLANT
WATER STERILE IRR 1000ML POUR (IV SOLUTION) ×3 IMPLANT

## 2019-09-25 NOTE — Progress Notes (Signed)
Subjective: The patient is alert and pleasant.  He is in no apparent distress.  He looks well.  Objective: Vital signs in last 24 hours: Temp:  [97.6 F (36.4 C)-98.6 F (37 C)] 97.6 F (36.4 C) (10/15 1830) Pulse Rate:  [72-88] 72 (10/15 1830) Resp:  [12-18] 12 (10/15 1830) BP: (130-145)/(80-82) 145/80 (10/15 1830) SpO2:  [94 %-100 %] 100 % (10/15 1830) Weight:  [109.1 kg] 109.1 kg (10/15 1331) Estimated body mass index is 31.73 kg/m as calculated from the following:   Height as of this encounter: 6\' 1"  (1.854 m).   Weight as of this encounter: 109.1 kg.   Intake/Output from previous day: No intake/output data recorded. Intake/Output this shift: Total I/O In: 1000 [I.V.:1000] Out: 10 [Blood:10]  Physical exam the patient is alert and pleasant.  He is moving his lower extremities well.  Lab Results: Recent Labs    09/23/19 1009  WBC 5.2  HGB 15.0  HCT 43.4  PLT 245   BMET Recent Labs    09/23/19 1009  NA 134*  K 4.2  CL 98  CO2 27  GLUCOSE 331*  BUN 9  CREATININE 0.79  CALCIUM 9.8    Studies/Results: No results found.  Assessment/Plan: The patient is doing well.  I spoke with his wife.  He will likely go home tomorrow.  LOS: 0 days     Ophelia Charter 09/25/2019, 6:34 PM

## 2019-09-25 NOTE — Anesthesia Procedure Notes (Signed)
Procedure Name: Intubation Date/Time: 09/25/2019 5:25 PM Performed by: Teressa Lower., CRNA Pre-anesthesia Checklist: Patient identified, Emergency Drugs available, Suction available and Patient being monitored Patient Re-evaluated:Patient Re-evaluated prior to induction Oxygen Delivery Method: Circle system utilized Preoxygenation: Pre-oxygenation with 100% oxygen Induction Type: IV induction Ventilation: Mask ventilation without difficulty Laryngoscope Size: Mac and 4 Grade View: Grade II Tube type: Oral Tube size: 7.5 mm Number of attempts: 1 Airway Equipment and Method: Stylet Placement Confirmation: ETT inserted through vocal cords under direct vision,  positive ETCO2 and breath sounds checked- equal and bilateral Secured at: 23 cm Tube secured with: Tape Dental Injury: Teeth and Oropharynx as per pre-operative assessment

## 2019-09-25 NOTE — Op Note (Signed)
Brief history: The patient is a 58 year old white male on whom I performed a redo L5-S1 discectomy about 2 months ago.  Initially did well but about a week ago developed headaches and a subcutaneous fluid collection.  I suspected a pseudomeningocele and recommended surgery.  The patient has weighed the risks, benefits and alternatives to surgery and decided proceed with an exploration of his wound with repair of pseudomeningocele.  Preop diagnosis: Lumbar pseudomeningocele  Postop diagnosis: The same  Procedure: Exploration of lumbar wound, repair of lumbar pseudomeningocele using microdissection  Surgeon: Dr. Earle Gell  Assistant: Dr. Ashok Pall  Anesthesia: General tracheal  Estimated blood loss: Minimal  Specimens: None  Drains: None  Complications: None  Description of procedure: The patient was brought to the operating room by the anesthesia team.  General endotracheal anesthesia was induced.  The patient was turned to the prone position on the Wilson frame.  His lumbar region was then shaved with clippers and prepared with Betadine scrub and Betadine solution.  Sterile drapes were applied.  I then used a scalpel to make an incision through the patient's previous lumbar scar.  There was immediate release of clear fluid consistent with spinal fluid.  We encountered a pseudomeningocele.  I inserted the McCullough retractor for exposure.  I dissected down to the epidural space.  We brought the operative microscope into the field and under its magnification and illumination completed the microdissection.  I inspected the thecal sac.  I encountered a small hole in the dorsolateral dura.  I closed the using a figure-of-eight 6-0 Prolene suture.  We then had anesthesia Valsalva the patient.  The closure appeared good.  I did not see any other areas of leakage.  I then placed DuraSeal over the exposed dura.  I then remove the retractors.  We reapproximated the patient's lumbar fascia with  interrupted 0 Vicryl suture.  We reapproximated the subcutaneous tissue with interrupted 2-0 Vicryl suture.  We reapproximate the skin with a running 2-0 nylon suture.  The wound was then coated with bacitracin ointment.  A sterile dressing was applied.  The drapes were removed.  By report all sponge, instrument, and needle counts were correct at the end of this case.

## 2019-09-25 NOTE — H&P (Signed)
Subjective: The patient is a 58 year old white male on whom I performed a redo L5-S1 discectomy on 07/17/2019.  The patient initially did well but then developed postural headaches with a subcutaneous lumbar fluid collection consistent with a pseudomeningocele.  I discussed the situation with the patient and recommended an exploration of his lumbar wound and repair of pseudomeningocele.  He has weighed the risks, benefits and alternatives surgery and decided proceed with surgery.  Past Medical History:  Diagnosis Date  . Amblyopia of eye, left    reports it is "incorrectable"   . Arthritis    TKA- & THR- based on OA  . Chronic kidney disease    kidney stones  . Dysrhythmia    occasional irregular HR  . Family history of adverse reaction to anesthesia    Mother gets PONV  . Headache    pseudomeningocele   . Hemorrhoids   . Internal thrombosed hemorrhoid-left lateral 05/23/2012  . Irregular heart beat    reports " they told me i had a heart murmur since i was a kid in school" reports as nonsymptomatic   . Pre-diabetes    told that he is borderline   . Radiculopathy of lumbar region   . Tinnitus    bilateral   . Tuberculosis    tests positively on TB skin tests, followed up with chest xray    Past Surgical History:  Procedure Laterality Date  . BACK SURGERY  2006   2017- back surg. for bone spurs, 07/2019- L5-S1 rupture  . COLONOSCOPY    . LUMBAR LAMINECTOMY/DECOMPRESSION MICRODISCECTOMY Bilateral 09/15/2016   Procedure: Bilateral Lumbar four - Lumber Five, Lumbar Five - Sacral One Laminectomy/Foraminotomy;  Surgeon: Hilda Lias, MD;  Location: [facility] OR;  Service: Neurosurgery;  Laterality: Bilateral;  Bilateral Lumbar four - Lumber Five, Lumbar Five - Sacral One Laminectomy/Foraminotomy  . LUMBAR WOUND DEBRIDEMENT N/A 11/13/2016   Procedure: EXPLORATION OF LUMBAR WOUND AND REPAIR OF CSF LEAK;  Surgeon: Hilda Lias, MD;  Location: [facility] OR;  Service: Neurosurgery;  Laterality: N/A;   EXPLORATION OF LUMBAR WOUND  . ROTATOR CUFF REPAIR Left 2013   and bone spurs   . TONSILLECTOMY    . TOTAL HIP ARTHROPLASTY Right 05/03/2016   Procedure: RIGHT TOTAL HIP ARTHROPLASTY ANTERIOR APPROACH;  Surgeon: Ollen Gross, MD;  Location: [facility] ORS;  Service: Orthopedics;  Laterality: Right;  . TOTAL KNEE ARTHROPLASTY  01/2009   left  . TOTAL KNEE REVISION Left 10/23/2018   Procedure: left knee polyethylene exchange with patella resurfacing;  Surgeon: Ollen Gross, MD;  Location: [facility] ORS;  Service: Orthopedics;  Laterality: Left;     Allergies  Allergen Reactions  . Percocet [Oxycodone-Acetaminophen] Itching and Nausea Only  . Zofran [Ondansetron Hcl]     Doesn't work    Social History   Tobacco Use  . Smoking status: Former Smoker    Types: Cigarettes  . Smokeless tobacco: Former Neurosurgeon    Quit date: 04/25/1997  Substance Use Topics  . Alcohol use: Yes    Comment: occasionally    Family History  Problem Relation Age of Onset  . Diabetes Mother   . Hypertension Mother   . Dementia Father    Prior to Admission medications   Medication Sig Start Date End Date Taking? Authorizing Provider  amphetamine-dextroamphetamine (ADDERALL XR) 30 MG 24 hr capsule Take 30 mg by mouth daily.   Yes [provider]  cyclobenzaprine (FLEXERIL) 10 MG tablet Take 10 mg by mouth 3 (three) times daily as  needed for muscle spasms.   Yes [provider]  ibuprofen (ADVIL) 200 MG tablet Take 400 mg by mouth every 6 (six) hours as needed.   Yes [provider]  metFORMIN (GLUCOPHAGE) 500 MG tablet Take 500 mg by mouth daily.   Yes [provider]  Omega-3 Fatty Acids (FISH OIL) 1000 MG CAPS Take 2,000 mg by mouth daily.   Yes [provider]  oxyCODONE-acetaminophen (PERCOCET/ROXICET) 5-325 MG tablet Take 1 tablet by mouth every 4 (four) hours as needed for severe pain.   Yes [provider]  rosuvastatin (CRESTOR) 5 MG tablet Take 5 mg by mouth  daily.   Yes [provider]  HYDROcodone-acetaminophen (NORCO/VICODIN) 5-325 MG tablet Take 1-2 tablets by mouth every 6 (six) hours as needed for moderate pain (pain score 4-6). Patient not taking: Reported on 09/19/2019 10/24/18   Edmisten, Drue Dun L, PA  methocarbamol (ROBAXIN) 500 MG tablet Take 1 tablet (500 mg total) by mouth every 6 (six) hours as needed for muscle spasms. Patient not taking: Reported on 09/19/2019 10/24/18   Edmisten, Ok Anis, PA  Misc Natural Products (CURCUMAX PRO) TABS Take 1 tablet by mouth daily.    [provider]     Review of Systems  Positive ROS: As above  All other systems have been reviewed and were otherwise negative with the exception of those mentioned in the HPI and as above.  Objective: Vital signs in last 24 hours: Temp:  [98.6 F (37 C)] 98.6 F (37 C) (10/15 1331) Pulse Rate:  [88] 88 (10/15 1331) Resp:  [18] 18 (10/15 1331) BP: (130)/(82) 130/82 (10/15 1406) SpO2:  [94 %-96 %] 96 % (10/15 1406) Weight:  [109.1 kg] 109.1 kg (10/15 1331) Estimated body mass index is 31.73 kg/m as calculated from the following:   Height as of this encounter: 6\' 1"  (1.854 m).   Weight as of this encounter: 109.1 kg.   General Appearance: Alert Head: Normocephalic, without obvious abnormality, atraumatic Eyes: PERRL, conjunctiva/corneas clear, EOM's intact,    Ears: Normal  Throat: Normal  Neck: Supple, Back: The patient's lumbar wound is well-healed but has a ballotable subcutaneous fluid collection. Lungs: Clear to auscultation bilaterally, respirations unlabored Heart: Regular rate and rhythm, no murmur, rub or gallop Abdomen: Soft, non-tender Extremities: Extremities normal, atraumatic, no cyanosis or edema Skin: unremarkable  NEUROLOGIC:   Mental status: alert and oriented,Motor Exam - grossly normal Sensory Exam - grossly normal Reflexes:  Coordination - grossly normal Gait - grossly normal Balance - grossly  normal Cranial Nerves: I: smell Not tested  II: visual acuity  OS: Normal  OD: Normal   II: visual fields Full to confrontation  II: pupils Equal, round, reactive to light  III,VII: ptosis None  III,IV,VI: extraocular muscles  Full ROM  V: mastication Normal  V: facial light touch sensation  Normal  V,VII: corneal reflex  Present  VII: facial muscle function - upper  Normal  VII: facial muscle function - lower Normal  VIII: hearing Not tested  IX: soft palate elevation  Normal  IX,X: gag reflex Present  XI: trapezius strength  5/5  XI: sternocleidomastoid strength 5/5  XI: neck flexion strength  5/5  XII: tongue strength  Normal    Data Review Lab Results  Component Value Date   WBC 5.2 09/23/2019   HGB 15.0 09/23/2019   HCT 43.4 09/23/2019   MCV 90.8 09/23/2019   PLT 245 09/23/2019   Lab Results  Component Value Date  NA 134 (L) 09/23/2019   K 4.2 09/23/2019   CL 98 09/23/2019   CO2 27 09/23/2019   BUN 9 09/23/2019   CREATININE 0.79 09/23/2019   GLUCOSE 331 (H) 09/23/2019   Lab Results  Component Value Date   INR 0.99 10/17/2018    Assessment/Plan: Lumbar pseudomeningocele: I have discussed the situation with the patient.  We have discussed the various treatment options including surgery.  I have described the surgical treatment option of an exploration of his lumbar wound with repair of pseudomeningocele and possible placement of lumbar drain.  I have discussed the risks, benefits, alternatives, expected postoperative course, and likelihood of achieving our goals with surgery.  I have answered all his questions.  He has decided to proceed with surgery.   Cristi LoronJeffrey D Mattheo Swindle 09/25/2019 4:25 PM

## 2019-09-25 NOTE — Transfer of Care (Signed)
Immediate Anesthesia Transfer of Care Note  Patient: Russell Gillespie  Procedure(s) Performed: WOUND EXPLORATION AND REPAIR OF LUMBAR PSEUDOMENINGOCELE (N/A Back)  Patient Location: PACU  Anesthesia Type:General  Level of Consciousness: awake, alert  and oriented  Airway & Oxygen Therapy: Patient Spontanous Breathing and Patient connected to face mask oxygen  Post-op Assessment: Report given to RN and Post -op Vital signs reviewed and stable  Post vital signs: Reviewed and stable  Last Vitals:  Vitals Value Taken Time  BP 145/80 09/25/19 1830  Temp    Pulse 34 09/25/19 1830  Resp 7 09/25/19 1830  SpO2 100 % 09/25/19 1830  Vitals shown include unvalidated device data.  Last Pain:  Vitals:   09/25/19 1331  TempSrc: Oral  PainSc: 3       Patients Stated Pain Goal: 4 (57/84/69 [id number])  Complications: No apparent anesthesia complications

## 2019-09-26 ENCOUNTER — Encounter (HOSPITAL_COMMUNITY): Payer: Self-pay | Admitting: Neurosurgery

## 2019-09-26 DIAGNOSIS — G9782 Other postprocedural complications and disorders of nervous system: Secondary | ICD-10-CM | POA: Diagnosis not present

## 2019-09-26 LAB — GLUCOSE, CAPILLARY: Glucose-Capillary: 187 mg/dL — ABNORMAL HIGH (ref 70–99)

## 2019-09-26 MED ORDER — OXYCODONE-ACETAMINOPHEN 5-325 MG PO TABS: 1.0000 | ORAL_TABLET | ORAL | 0 refills | Status: DC | PRN

## 2019-09-26 MED ORDER — DOCUSATE SODIUM 100 MG PO CAPS: 100.0000 mg | ORAL_CAPSULE | Freq: Two times a day (BID) | ORAL | 0 refills | Status: AC

## 2019-09-26 NOTE — Plan of Care (Signed)
Patient alert and oriented, mae's well, voiding adequate amount of urine, swallowing without difficulty, no c/o pain at time of discharge. Patient discharged home with family. Script and discharged instructions given to patient. Patient and family stated understanding of instructions given. Patient has an appointment with Dr. Jenkins   

## 2019-09-26 NOTE — Discharge Summary (Signed)
Physician Discharge Summary  Patient ID: Russell Gillespie MRN: [id number] DOB/AGE: Jan 30, 1961 58 y.o.  Admit date: 09/25/2019 Discharge date: 09/26/2019  Admission Diagnoses: Lumbar pseudomeningocele  Discharge Diagnoses: The same Active Problems:   Postprocedural pseudomeningocele   Discharged Condition: good  Hospital Course: I performed a repair of the patient's lumbar pseudomeningocele on 09/25/2019.  The surgery went well.  The patient's postoperative course was unremarkable.  On postoperative day #1 his headache was gone and he requested discharge to home.  He was given written and oral discharge instructions.  All his questions were answered.  Consults: None Significant Diagnostic Studies: None Treatments: Repair of lumbar pseudomeningocele Discharge Exam: Blood pressure 104/76, pulse 64, temperature 97.7 F (36.5 C), temperature source Oral, resp. rate 20, height 6\' 1"  (1.854 m), weight 109.1 kg, SpO2 96 %. The patient is alert and pleasant.  He looks well.  His strength is normal.  Disposition: Home  Discharge Instructions    Call MD for:  difficulty breathing, headache or visual disturbances   Complete by: As directed    Call MD for:  extreme fatigue   Complete by: As directed    Call MD for:  hives   Complete by: As directed    Call MD for:  persistant dizziness or light-headedness   Complete by: As directed    Call MD for:  persistant nausea and vomiting   Complete by: As directed    Call MD for:  redness, tenderness, or signs of infection (pain, swelling, redness, odor or green/yellow discharge around incision site)   Complete by: As directed    Call MD for:  severe uncontrolled pain   Complete by: As directed    Call MD for:  temperature >100.4   Complete by: As directed    Diet - low sodium heart healthy   Complete by: As directed    Discharge instructions   Complete by: As directed    Call [phone] for a followup appointment. Take a stool  softener while you are using pain medications.   Driving Restrictions   Complete by: As directed    Do not drive for 2 weeks.   Increase activity slowly   Complete by: As directed    Lifting restrictions   Complete by: As directed    Do not lift more than 5 pounds. No excessive bending or twisting.   May shower / Bathe   Complete by: As directed    Remove the dressing for 3 days after surgery.  You may shower, but leave the incision alone.   Remove dressing in 48 hours   Complete by: As directed    Your stitches are under the scan and will dissolve by themselves. The Steri-Strips will fall off after you take a few showers. Do not rub back or pick at the wound, Leave the wound alone.     Allergies as of 09/26/2019      Reactions   Percocet [oxycodone-acetaminophen] Itching, Nausea Only   Zofran [ondansetron Hcl]    Doesn't work      Medication List    STOP taking these medications   HYDROcodone-acetaminophen 5-325 MG tablet Commonly known as: NORCO/VICODIN   methocarbamol 500 MG tablet Commonly known as: ROBAXIN     TAKE these medications   amphetamine-dextroamphetamine 30 MG 24 hr capsule Commonly known as: ADDERALL XR Take 30 mg by mouth daily.   Curcumax Pro Tabs Take 1 tablet by mouth daily.   cyclobenzaprine 10 MG tablet Commonly known as: FLEXERIL  Take 10 mg by mouth 3 (three) times daily as needed for muscle spasms.   docusate sodium 100 MG capsule Commonly known as: COLACE Take 1 capsule (100 mg total) by mouth 2 (two) times daily.   Fish Oil 1000 MG Caps Take 2,000 mg by mouth daily.   ibuprofen 200 MG tablet Commonly known as: ADVIL Take 400 mg by mouth every 6 (six) hours as needed.   metFORMIN 500 MG tablet Commonly known as: GLUCOPHAGE Take 500 mg by mouth daily.   oxyCODONE-acetaminophen 5-325 MG tablet Commonly known as: PERCOCET/ROXICET Take 1-2 tablets by mouth every 4 (four) hours as needed for severe pain. What changed: how much to  take   rosuvastatin 5 MG tablet Commonly known as: CRESTOR Take 5 mg by mouth daily.        Signed: Ophelia Charter 09/26/2019, 7:11 AM

## 2019-09-26 NOTE — Anesthesia Postprocedure Evaluation (Signed)
Anesthesia Post Note  Patient: Russell Gillespie  Procedure(s) Performed: WOUND EXPLORATION AND REPAIR OF LUMBAR PSEUDOMENINGOCELE (N/A Back)     Patient location during evaluation: PACU Anesthesia Type: General Level of consciousness: awake and alert Pain management: pain level controlled Vital Signs Assessment: post-procedure vital signs reviewed and stable Respiratory status: spontaneous breathing, nonlabored ventilation, respiratory function stable and patient connected to nasal cannula oxygen Cardiovascular status: blood pressure returned to baseline and stable Postop Assessment: no apparent nausea or vomiting Anesthetic complications: no    Last Vitals:  Vitals:   09/26/19 0352 09/26/19 0742  BP: 104/76 119/67  Pulse: 64 (!) 55  Resp: 20 18  Temp: 36.5 C 36.7 C  SpO2: 96% 94%    Last Pain:  Vitals:   09/26/19 0439  TempSrc:   PainSc: 0-No pain                 Blondell Laperle DAVID

## 2020-07-22 ENCOUNTER — Emergency Department (HOSPITAL_COMMUNITY): Payer: 59

## 2020-07-22 ENCOUNTER — Other Ambulatory Visit: Payer: Self-pay

## 2020-07-22 ENCOUNTER — Emergency Department (HOSPITAL_COMMUNITY): Payer: No Typology Code available for payment source

## 2020-07-22 ENCOUNTER — Emergency Department (HOSPITAL_COMMUNITY): Payer: 59 | Attending: Emergency Medicine

## 2020-07-22 ENCOUNTER — Emergency Department (HOSPITAL_COMMUNITY)
Admission: EM | Admit: 2020-07-22 | Discharge: 2020-07-22 | Disposition: A | Discharge status: Home / Self Care | Payer: No Typology Code available for payment source | Attending: Emergency Medicine | Admitting: Emergency Medicine

## 2020-07-22 ENCOUNTER — Encounter (HOSPITAL_COMMUNITY): Payer: Self-pay | Admitting: Obstetrics and Gynecology

## 2020-07-22 DIAGNOSIS — Z7984 Long term (current) use of oral hypoglycemic drugs: Secondary | ICD-10-CM | POA: Insufficient documentation

## 2020-07-22 DIAGNOSIS — Z87891 Personal history of nicotine dependence: Secondary | ICD-10-CM | POA: Diagnosis not present

## 2020-07-22 DIAGNOSIS — M545 Low back pain: Secondary | ICD-10-CM | POA: Diagnosis not present

## 2020-07-22 DIAGNOSIS — R7303 Prediabetes: Secondary | ICD-10-CM | POA: Diagnosis not present

## 2020-07-22 DIAGNOSIS — N189 Chronic kidney disease, unspecified: Secondary | ICD-10-CM | POA: Insufficient documentation

## 2020-07-22 DIAGNOSIS — M25512 Pain in left shoulder: Secondary | ICD-10-CM | POA: Diagnosis not present

## 2020-07-22 DIAGNOSIS — Y999 Unspecified external cause status: Secondary | ICD-10-CM | POA: Insufficient documentation

## 2020-07-22 DIAGNOSIS — Y92410 Unspecified street and highway as the place of occurrence of the external cause: Secondary | ICD-10-CM | POA: Diagnosis not present

## 2020-07-22 DIAGNOSIS — M542 Cervicalgia: Secondary | ICD-10-CM

## 2020-07-22 DIAGNOSIS — S299XXA Unspecified injury of thorax, initial encounter: Secondary | ICD-10-CM | POA: Insufficient documentation

## 2020-07-22 DIAGNOSIS — S14109A Unspecified injury at unspecified level of cervical spinal cord, initial encounter: Secondary | ICD-10-CM | POA: Insufficient documentation

## 2020-07-22 DIAGNOSIS — Y939 Activity, unspecified: Secondary | ICD-10-CM | POA: Diagnosis not present

## 2020-07-22 DIAGNOSIS — M549 Dorsalgia, unspecified: Secondary | ICD-10-CM

## 2020-07-22 LAB — CBC WITH DIFFERENTIAL/PLATELET
Abs Immature Granulocytes: 0.02 10*3/uL (ref 0.00–0.07)
Basophils Absolute: 0.1 10*3/uL (ref 0.0–0.1)
Basophils Relative: 1 %
Eosinophils Absolute: 0.1 10*3/uL (ref 0.0–0.5)
Eosinophils Relative: 1 %
HCT: 39.4 % (ref 39.0–52.0)
Hemoglobin: 13.6 g/dL (ref 13.0–17.0)
Immature Granulocytes: 0 %
Lymphocytes Relative: 18 %
Lymphs Abs: 1.5 10*3/uL (ref 0.7–4.0)
MCH: 32.2 pg (ref 26.0–34.0)
MCHC: 34.5 g/dL (ref 30.0–36.0)
MCV: 93.4 fL (ref 80.0–100.0)
Monocytes Absolute: 0.5 10*3/uL (ref 0.1–1.0)
Monocytes Relative: 6 %
Neutro Abs: 6.3 10*3/uL (ref 1.7–7.7)
Neutrophils Relative %: 74 %
Platelets: 222 10*3/uL (ref 150–400)
RBC: 4.22 MIL/uL (ref 4.22–5.81)
RDW: 12.7 % (ref 11.5–15.5)
WBC: 8.5 10*3/uL (ref 4.0–10.5)
nRBC: 0 % (ref 0.0–0.2)

## 2020-07-22 LAB — BASIC METABOLIC PANEL
Anion gap: 7 (ref 5–15)
BUN: 13 mg/dL (ref 6–20)
CO2: 27 mmol/L (ref 22–32)
Calcium: 8.8 mg/dL — ABNORMAL LOW (ref 8.9–10.3)
Chloride: 104 mmol/L (ref 98–111)
Creatinine, Ser: 0.8 mg/dL (ref 0.61–1.24)
GFR calc Af Amer: >60 mL/min (ref >60)
GFR calc non Af Amer: >60 mL/min (ref >60)
Glucose, Bld: 269 mg/dL — ABNORMAL HIGH (ref 70–99)
Potassium: 3.7 mmol/L (ref 3.5–5.1)
Sodium: 138 mmol/L (ref 135–145)

## 2020-07-22 MED ORDER — METHYLPREDNISOLONE 4 MG PO TBPK: ORAL_TABLET | ORAL | 0 refills | Status: DC

## 2020-07-22 MED ORDER — CYCLOBENZAPRINE HCL 10 MG PO TABS
10.0000 mg | ORAL_TABLET | Freq: Once | ORAL | Status: AC
  Administered 2020-07-22: 10 mg via ORAL
  Filled 2020-07-22: qty 1

## 2020-07-22 MED ORDER — IOHEXOL 300 MG/ML  SOLN
100.0000 mL | Freq: Once | INTRAMUSCULAR | Status: AC | PRN
  Administered 2020-07-22: 100 mL via INTRAVENOUS

## 2020-07-22 MED ORDER — FENTANYL CITRATE (PF) 100 MCG/2ML IJ SOLN
50.0000 ug | Freq: Once | INTRAMUSCULAR | Status: AC
  Administered 2020-07-22: 50 ug via INTRAVENOUS
  Filled 2020-07-22: qty 2
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
## 2020-07-22 NOTE — ED Notes (Signed)
Patient transported to CT 

## 2020-07-22 NOTE — Discharge Instructions (Addendum)
Please return to the emergency department if you develop any worsening symptoms including right arm weakness.  Follow-up with neurosurgery.

## 2020-09-21 ENCOUNTER — Observation Stay (HOSPITAL_COMMUNITY): Payer: No Typology Code available for payment source

## 2020-09-21 ENCOUNTER — Observation Stay (HOSPITAL_COMMUNITY)
Admission: EM | Admit: 2020-09-21 | Discharge: 2020-09-23 | Disposition: A | Discharge status: Home / Self Care | Payer: No Typology Code available for payment source | Attending: Neurological Surgery | Admitting: Neurological Surgery

## 2020-09-21 ENCOUNTER — Observation Stay (HOSPITAL_COMMUNITY): Payer: 59

## 2020-09-21 DIAGNOSIS — Z79899 Other long term (current) drug therapy: Secondary | ICD-10-CM | POA: Diagnosis not present

## 2020-09-21 DIAGNOSIS — Z96641 Presence of right artificial hip joint: Secondary | ICD-10-CM | POA: Insufficient documentation

## 2020-09-21 DIAGNOSIS — R1310 Dysphagia, unspecified: Secondary | ICD-10-CM | POA: Diagnosis not present

## 2020-09-21 DIAGNOSIS — M4322 Fusion of spine, cervical region: Secondary | ICD-10-CM | POA: Diagnosis not present

## 2020-09-21 DIAGNOSIS — J9589 Other postprocedural complications and disorders of respiratory system, not elsewhere classified: Secondary | ICD-10-CM | POA: Diagnosis not present

## 2020-09-21 DIAGNOSIS — N183 Chronic kidney disease, stage 3 unspecified: Secondary | ICD-10-CM | POA: Diagnosis not present

## 2020-09-21 DIAGNOSIS — E1122 Type 2 diabetes mellitus with diabetic chronic kidney disease: Secondary | ICD-10-CM | POA: Diagnosis not present

## 2020-09-21 DIAGNOSIS — Z87891 Personal history of nicotine dependence: Secondary | ICD-10-CM | POA: Diagnosis not present

## 2020-09-21 DIAGNOSIS — Z96652 Presence of left artificial knee joint: Secondary | ICD-10-CM | POA: Diagnosis not present

## 2020-09-21 DIAGNOSIS — Z7984 Long term (current) use of oral hypoglycemic drugs: Secondary | ICD-10-CM | POA: Diagnosis not present

## 2020-09-21 DIAGNOSIS — Z20822 Contact with and (suspected) exposure to covid-19: Secondary | ICD-10-CM | POA: Diagnosis not present

## 2020-09-21 LAB — BASIC METABOLIC PANEL
Anion gap: 10 (ref 5–15)
BUN: 7 mg/dL (ref 6–20)
CO2: 28 mmol/L (ref 22–32)
Calcium: 9.6 mg/dL (ref 8.9–10.3)
Chloride: 103 mmol/L (ref 98–111)
Creatinine, Ser: 0.81 mg/dL (ref 0.61–1.24)
GFR, Estimated: >60 mL/min (ref >60)
Glucose, Bld: 131 mg/dL — ABNORMAL HIGH (ref 70–99)
Potassium: 4 mmol/L (ref 3.5–5.1)
Sodium: 141 mmol/L (ref 135–145)

## 2020-09-21 LAB — RESPIRATORY PANEL BY RT PCR (FLU A&B, COVID)
Influenza A by PCR: NEGATIVE
Influenza B by PCR: NEGATIVE
SARS Coronavirus 2 by RT PCR: NEGATIVE

## 2020-09-21 LAB — CBC
HCT: 40 % (ref 39.0–52.0)
Hemoglobin: 13.4 g/dL (ref 13.0–17.0)
MCH: 31.2 pg (ref 26.0–34.0)
MCHC: 33.5 g/dL (ref 30.0–36.0)
MCV: 93 fL (ref 80.0–100.0)
Platelets: 254 10*3/uL (ref 150–400)
RBC: 4.3 MIL/uL (ref 4.22–5.81)
RDW: 12.4 % (ref 11.5–15.5)
WBC: 11.2 10*3/uL — ABNORMAL HIGH (ref 4.0–10.5)
nRBC: 0 % (ref 0.0–0.2)

## 2020-09-21 MED ORDER — INSULIN ASPART 100 UNIT/ML ~~LOC~~ SOLN
0.0000 [IU] | Freq: Three times a day (TID) | SUBCUTANEOUS | Status: DC
  Administered 2020-09-22: 3 [IU] via SUBCUTANEOUS
  Administered 2020-09-22: 2 [IU] via SUBCUTANEOUS
  Administered 2020-09-23: 3 [IU] via SUBCUTANEOUS

## 2020-09-21 MED ORDER — METHOCARBAMOL 1000 MG/10ML IJ SOLN
500.0000 mg | Freq: Three times a day (TID) | INTRAVENOUS | Status: DC | PRN
  Administered 2020-09-21: 500 mg via INTRAVENOUS
  Filled 2020-09-21 (×4): qty 5

## 2020-09-21 MED ORDER — DEXAMETHASONE SODIUM PHOSPHATE 10 MG/ML IJ SOLN
6.0000 mg | Freq: Once | INTRAMUSCULAR | Status: AC
  Administered 2020-09-21: 6 mg via INTRAVENOUS
  Filled 2020-09-21: qty 1

## 2020-09-21 MED ORDER — ONDANSETRON HCL 4 MG/2ML IJ SOLN: 4.0000 mg | Freq: Three times a day (TID) | INTRAMUSCULAR | Status: DC | PRN

## 2020-09-21 MED ORDER — SODIUM CHLORIDE 0.9 % IV SOLN: INTRAVENOUS | Status: DC

## 2020-09-21 MED ORDER — MORPHINE SULFATE (PF) 2 MG/ML IV SOLN: 2.0000 mg | INTRAVENOUS | Status: DC | PRN

## 2020-09-21 MED ORDER — HYDROMORPHONE HCL 1 MG/ML IJ SOLN
1.0000 mg | INTRAMUSCULAR | Status: DC | PRN
  Administered 2020-09-21 – 2020-09-23 (×7): 1 mg via INTRAVENOUS
  Filled 2020-09-21 (×7): qty 1

## 2020-09-21 NOTE — H&P (Signed)
Providing Compassionate, Quality Care - Together  NEUROSURGERY HISTORY & PHYSICAL   Russell Gillespie is an 59 y.o. male.   Chief Complaint: Neck swelling, difficulty swallowing  HPI: This is a 59 year old male that had a C3-7 anterior cervical decompression fusion on 09/20/2020 by Dr. Lovell Sheehan.  He was discharged home today.  He notes progressively throughout the day today has had a harder time swallowing, he has had some swelling around his incision and feels as though he has difficulty breathing.  He denies fevers.  He denies any new numbness, tingling, weakness.  He has been able to take his oral medication up until approximately 3 PM.  Due to the difficulty in breathing his wife called EMS to transport him to the hospital.  Past Medical History:  Diagnosis Date  . Amblyopia of eye, left    reports it is "incorrectable"   . Arthritis    TKA- & THR- based on OA  . Chronic kidney disease    kidney stones  . Dysrhythmia    occasional irregular HR  . Family history of adverse reaction to anesthesia    Mother gets PONV  . Headache    pseudomeningocele   . Hemorrhoids   . Internal thrombosed hemorrhoid-left lateral 05/23/2012  . Irregular heart beat    reports " they told me i had a heart murmur since i was a kid in school" reports as nonsymptomatic   . Pre-diabetes    told that he is borderline   . Radiculopathy of lumbar region   . Tinnitus    bilateral   . Tuberculosis    tests positively on TB skin tests, followed up with chest xray    Past Surgical History:  Procedure Laterality Date  . BACK SURGERY  2006   2017- back surg. for bone spurs, 07/2019- L5-S1 rupture  . COLONOSCOPY    . LUMBAR LAMINECTOMY/DECOMPRESSION MICRODISCECTOMY Bilateral 09/15/2016   Procedure: Bilateral Lumbar four - Lumber Five, Lumbar Five - Sacral One Laminectomy/Foraminotomy;  Surgeon: Hilda Lias, MD;  Location: [facility] OR;  Service: Neurosurgery;  Laterality: Bilateral;  Bilateral Lumbar  four - Lumber Five, Lumbar Five - Sacral One Laminectomy/Foraminotomy  . LUMBAR WOUND DEBRIDEMENT N/A 11/13/2016   Procedure: EXPLORATION OF LUMBAR WOUND AND REPAIR OF CSF LEAK;  Surgeon: Hilda Lias, MD;  Location: [facility] OR;  Service: Neurosurgery;  Laterality: N/A;  EXPLORATION OF LUMBAR WOUND  . LUMBAR WOUND DEBRIDEMENT N/A 09/25/2019   Procedure: WOUND EXPLORATION AND REPAIR OF LUMBAR PSEUDOMENINGOCELE;  Surgeon: Tressie Stalker, MD;  Location: [facility] OR;  Service: Neurosurgery;  Laterality: N/A;  . ROTATOR CUFF REPAIR Left 2013   and bone spurs   . TONSILLECTOMY    . TOTAL HIP ARTHROPLASTY Right 05/03/2016   Procedure: RIGHT TOTAL HIP ARTHROPLASTY ANTERIOR APPROACH;  Surgeon: Ollen Gross, MD;  Location: [facility] ORS;  Service: Orthopedics;  Laterality: Right;  . TOTAL KNEE ARTHROPLASTY  01/2009   left  . TOTAL KNEE REVISION Left 10/23/2018   Procedure: left knee polyethylene exchange with patella resurfacing;  Surgeon: Ollen Gross, MD;  Location: [facility] ORS;  Service: Orthopedics;  Laterality: Left;     Family History  Problem Relation Age of Onset  . Diabetes Mother   . Hypertension Mother   . Dementia Father    Social History:  reports that he has quit smoking. His smoking use included cigarettes. He quit smokeless tobacco use about 23 years ago. He reports current alcohol use. He reports that he does not use  drugs.  Allergies:  Allergies  Allergen Reactions  . Zofran [Ondansetron Hcl]     Doesn't work    (Not in a hospital admission)   Results for orders placed or performed during the hospital encounter of 09/21/20 (from the past 48 hour(s))  CBC     Status: Abnormal   Collection Time: 09/21/20  8:14 PM  Result Value Ref Range   WBC 11.2 (H) 4.0 - 10.5 K/uL   RBC 4.30 4.22 - 5.81 MIL/uL   Hemoglobin 13.4 13.0 - 17.0 g/dL   HCT 54.6 39 - 52 %   MCV 93.0 80.0 - 100.0 fL   MCH 31.2 26.0 - 34.0 pg   MCHC 33.5 30.0 - 36.0 g/dL   RDW 27.0 35.0 - 09.3 %   Platelets 254 150  - 400 K/uL   nRBC 0.0 0.0 - 0.2 %    Comment: Performed at [facility], [address], [place], [place] [address]   No results found.  ROS 14 point review of systems was obtained which all pertinent positives and negatives are listed in HPI above Blood pressure (!) 167/103, pulse (!) 34, temperature 98.3 F (36.8 C), temperature source Oral, resp. rate 12, height 6\' 1"  (1.854 m), weight 104.3 kg, SpO2 99 %. Physical Exam  AOx3 Vital signs stable, no acute distress, nonlabored breathing Neck is soft, trachea is midline, normal phonation, nonstridorous Slight soft tissue swelling around his left sided incision, Steri-Strips in place No erythema or signs of infection Slight serosanguineous fluid noted to be draining out of previous drain site Bilateral upper/lower extremities 5/5 strength Some difficulty swallowing  Assessment/Plan 59 year old male with  1.  Cervical spondylosis, C3-C7 2.  Postoperative dysphagia  -Status post ACDF C3-C7 on 09/20/2020   -CT soft tissue without contrast of the neck pending -I do not see or appreciate a palpable significant hematoma that would require emergent evacuation -We will observe him overnight, treat his pain with IV pain medications -Decadron IV -Continuous pulse ox
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
## 2020-09-21 NOTE — ED Triage Notes (Signed)
Pt arrived via EMS from home after pt is complaining of increased swelling and shortness of breath from neck incision. Pt had Cervical fusion yesterday and was discharged earlier today but pt believes his anterior incision is becoming more swollen and putting pressure on his neck causing him to become more short of breath.

## 2020-09-21 NOTE — ED Notes (Signed)
Call Dr. Jake Samples on arrival [phone] Post op cervical fusion, 09/20/20  Concerned swelling and difficult breathing,

## 2020-09-22 ENCOUNTER — Other Ambulatory Visit: Payer: Self-pay

## 2020-09-22 LAB — GLUCOSE, CAPILLARY
Glucose-Capillary: 132 mg/dL — ABNORMAL HIGH (ref 70–99)
Glucose-Capillary: 135 mg/dL — ABNORMAL HIGH (ref 70–99)
Glucose-Capillary: 167 mg/dL — ABNORMAL HIGH (ref 70–99)
Glucose-Capillary: 184 mg/dL — ABNORMAL HIGH (ref 70–99)
Glucose-Capillary: 208 mg/dL — ABNORMAL HIGH (ref 70–99)

## 2020-09-22 MED ORDER — MORPHINE SULFATE (PF) 2 MG/ML IV SOLN
2.0000 mg | INTRAVENOUS | Status: DC | PRN
  Administered 2020-09-22: 2 mg via INTRAVENOUS
  Filled 2020-09-22: qty 1

## 2020-09-22 MED ORDER — DEXAMETHASONE SODIUM PHOSPHATE 10 MG/ML IJ SOLN
6.0000 mg | Freq: Four times a day (QID) | INTRAMUSCULAR | Status: DC
  Administered 2020-09-22 – 2020-09-23 (×5): 6 mg via INTRAVENOUS
  Filled 2020-09-22 (×5): qty 1

## 2020-09-22 NOTE — TOC Initial Note (Addendum)
Transition of Care [facility]) - Initial/Assessment Note    Patient Details  Name: Russell Gillespie MRN: [id number] Date of Birth: 1961/07/06  [facility] CM/SW Contact:    Lawerance Sabal, RN Phone Number: 09/22/2020, 11:41 AM  Clinical Narrative:       Patient provided the following contact information for worker's comp:  Claim# [id number]  Bary Richard of [facility] Worker's Comp Phone- [phone] Main Number- [phone]  No answer, LVM requesting callback  Spoke w Noreene Larsson in customer service. She will send email to [place] to update on surgery and this stay.   Patient lives at home, has support of wife. Hospitalized for post surgical swelling of neck and dysphagia.   AddendumMarthe Patch from [place] of [facility] Worker's Comp Please Fax notes at DC to [phone] [facility] [address] [facility] his worker's Chief Strategy Officer on file for his prescriptions and the patient has a card as well.                  Expected Discharge Plan: Home/Self Care Barriers to Discharge: Continued Medical Work up   Patient Goals and CMS Choice        Expected Discharge Plan and Services Expected Discharge Plan: Home/Self Care   Discharge Planning Services: CM Consult   Living arrangements for the past 2 months: Single Family Home                                      Prior Living Arrangements/Services Living arrangements for the past 2 months: Single Family Home Lives with:: Spouse                   Activities of Daily Living Home Assistive Devices/Equipment: Blood pressure cuff, CBG Meter, C-collar, Eyeglasses ADL Screening (condition at time of admission) Patient's cognitive ability adequate to safely complete daily activities?: Yes Is the patient deaf or have difficulty hearing?: No Does the patient have difficulty seeing, even when wearing glasses/contacts?: No Does the patient have difficulty concentrating, remembering, or making decisions?: No Patient able to  express need for assistance with ADLs?: Yes Does the patient have difficulty dressing or bathing?: No Independently performs ADLs?: Yes (appropriate for developmental age) Does the patient have difficulty walking or climbing stairs?: No Weakness of Legs: None Weakness of Arms/Hands: None  Permission Sought/Granted                  Emotional Assessment              Admission diagnosis:  Dysphagia [R13.10] Patient Active Problem List   Diagnosis Date Noted  . Dysphagia 09/21/2020  . 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 Lumbar radiculopathy 11/13/2016  . Lumbar stenosis 09/15/2016  . OA (osteoarthritis) of hip 05/03/2016  . Internal thrombosed hemorrhoid-left lateral 05/23/2012   PCP:  Tally Joe, MD Pharmacy:   [facility] [address] [place] - [address] [place] [address] Phone: [phone] Fax: [phone]  [id number] [place], [place] - [address] AT [address] [place] [address] Phone: [phone] Fax: [phone]     Social Determinants of Health (SDOH) Interventions    Readmission Risk Interventions No flowsheet data found.

## 2020-09-22 NOTE — Progress Notes (Signed)
Pt c/o progressive SOB this am re- assessed n0 increased swelling at the incision site MD on cal for NS made aware , stated will round  On pt and see pt soon. VS done stable Hr but 68 b/m radially.    No acute distress. Pt care endorsed to oncoming RN AMY.Marland Kitchen
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
## 2020-09-22 NOTE — Plan of Care (Signed)
  Problem: Activity: Goal: Will remain free from falls Outcome: Progressing   Problem: Activity: Goal: Ability to tolerate increased activity will improve Outcome: Progressing Goal: Will remain free from falls Outcome: Progressing   Problem: Activity: Goal: Ability to tolerate increased activity will improve Outcome: Progressing   Problem: Education: Goal: Understanding of discharge needs will improve Outcome: Progressing   Problem: Education: Goal: Knowledge of the prescribed therapeutic regimen will improve Outcome: Progressing

## 2020-09-23 LAB — GLUCOSE, CAPILLARY: Glucose-Capillary: 172 mg/dL — ABNORMAL HIGH (ref 70–99)

## 2020-09-23 MED ORDER — DOCUSATE SODIUM 100 MG PO CAPS
100.0000 mg | ORAL_CAPSULE | Freq: Once | ORAL | Status: AC
  Administered 2020-09-23: 100 mg via ORAL
  Filled 2020-09-23: qty 1

## 2020-09-23 MED ORDER — OXYCODONE-ACETAMINOPHEN 5-325 MG PO TABS
1.0000 | ORAL_TABLET | Freq: Once | ORAL | Status: AC
  Administered 2020-09-23: 1 via ORAL
  Filled 2020-09-23: qty 1

## 2020-09-23 MED ORDER — METHYLPREDNISOLONE 4 MG PO TBPK: ORAL_TABLET | ORAL | 0 refills | Status: DC

## 2020-09-23 NOTE — Discharge Instructions (Signed)
Dysphagia  Dysphagia is trouble swallowing. This condition occurs when solids and liquids stick in a person's throat on the way down to the stomach, or when food takes longer to get to the stomach than usual. You may have problems swallowing food, liquids, or both. You may also have pain while trying to swallow. It may take you more time and effort to swallow something. What are the causes? This condition may be caused by:  Muscle problems. They may make it difficult for you to move food and liquids through the esophagus, which is the tube that connects your mouth to your stomach.  Blockages. You may have ulcers, scar tissue, or inflammation that blocks the normal passage of food and liquids. Causes of these problems include: ? Acid reflux from your stomach into your esophagus (gastroesophageal reflux). ? Infections. ? Radiation treatment for cancer. ? Medicines taken without enough fluids to wash them down into your stomach.  Stroke. This can affect the nerves and make it difficult to swallow.  Nerve problems. These prevent signals from being sent to the muscles of your esophagus to squeeze (contract) and move what you swallow down to your stomach.  Globus pharyngeus. This is a common problem that involves a feeling like something is stuck in your throat or a sense of trouble with swallowing, even though nothing is wrong with the swallowing passages.  Certain conditions, such as cerebral palsy or Parkinson's disease. What are the signs or symptoms? Common symptoms of this condition include:  A feeling that solids or liquids are stuck in your throat on the way down to the stomach.  Pain while swallowing.  Coughing or gagging while trying to swallow. Other symptoms include:  Food moving back from your stomach to your mouth (regurgitation).  Noises coming from your throat.  Chest discomfort with swallowing.  A feeling of fullness when swallowing.  Drooling, especially when the  throat is blocked.  Heartburn. How is this diagnosed? This condition may be diagnosed by:  Barium X-ray. In this test, you will swallow a white liquid that sticks to the inside of your esophagus. X-ray images are then taken.  Endoscopy. In this test, a flexible telescope is inserted down your throat to look at your esophagus and your stomach.  CT scans and an MRI. How is this treated? Treatment for dysphagia depends on the cause of this condition, such as:  If the dysphagia is caused by acid reflux or infection, medicines may be used. They may include antibiotics and heartburn medicines.  If the dysphagia is caused by problems with the muscles, swallowing therapy may be used to help you strengthen your swallowing muscles. You may have to do specific exercises to strengthen the muscles or stretch them.  If the dysphagia is caused by a blockage or mass, procedures to remove the blockage may be done. You may need surgery and a feeding tube. You may need to make diet changes. Ask your health care provider for specific instructions. Follow these instructions at home: Medicines  Take over-the-counter and prescription medicines only as told by your health care provider.  If you were prescribed an antibiotic medicine, take it as told by your health care provider. Do not stop taking the antibiotic even if you start to feel better. Eating and drinking   Follow any diet changes as told by your health care provider.  Work with a diet and nutrition specialist (dietitian) to create an eating plan that will help you get the nutrients you need in   order to stay healthy.  Eat soft foods that are easier to swallow.  Cut your food into small pieces and eat slowly. Take small bites.  Eat and drink only when you are sitting upright.  Do not drink alcohol or caffeine. If you need help quitting, ask your health care provider. General instructions  Check your weight every day to make sure you are  not losing weight.  Do not use any products that contain nicotine or tobacco, such as cigarettes, e-cigarettes, and chewing tobacco. If you need help quitting, ask your health care provider.  Keep all follow-up visits as told by your health care provider. This is important. Contact a health care provider if you:  Lose weight because you cannot swallow.  Cough when you drink liquids.  Cough up partially digested food. Get help right away if you:  Cannot swallow your saliva.  Have shortness of breath, a fever, or both.  Have a hoarse voice and also have trouble swallowing. Summary  Dysphagia is trouble swallowing. This condition occurs when solids and liquids stick in a person's throat on the way down to the stomach. You may cough or gag while trying to swallow.  Dysphagia has many possible causes.  Treatment for dysphagia depends on the cause of the condition.  Keep all follow-up visits as told by your health care provider. This is important. This information is not intended to replace advice given to you by your health care provider. Make sure you discuss any questions you have with your health care provider. Document Revised: 04/23/2019 Document Reviewed: 04/23/2019 Elsevier Patient Education  2020 Elsevier Inc.  

## 2020-09-23 NOTE — Plan of Care (Signed)
Patient discharged to go home today 

## 2020-09-23 NOTE — Discharge Summary (Signed)
   Physician Discharge Summary  Patient ID: Russell Gillespie MRN: [id number] DOB/AGE: [birth year] 59 y.o.  Admit date: 09/21/2020 Discharge date: 09/23/2020  Admission Diagnoses:  1.  Dysphagia, postop from ACDF C3-7  Discharge Diagnoses:  Same Active Problems:   Dysphagia   Discharged Condition: Stable  Hospital Course:  Russell Gillespie is a 59 y.o. male who underwent the below surgery on 09/20/2020 and tolerated it well.  He was discharged on postoperative day 1.  He began to have more difficulty swallowing, breathing and was concerned about swelling around his incision.  He was observed in the hospital and noted to have improvement with IV steroids in terms of his dysphagia and shortness of breath.  His periincisional swelling was minimal and there was no palpable hematoma.  CT soft tissue of the neck showed no hematoma.  His pain was controlled with oral medication and he was at his neurologic baseline upon discharge.  He was having normal bowel and bladder function.  He was tolerating a diet upon discharge.  Treatments: Observation, treatment with IV steroids.  Status post surgery -ACDF C3-C7, 09/20/2020 by Dr. Lovell Sheehan at [facility]  Discharge Exam: Blood pressure (!) 148/88, pulse (!) 56, temperature 97.9 F (36.6 C), temperature source Oral, resp. rate 18, height 6\' 1"  (1.854 m), weight 104.3 kg, SpO2 96 %. Awake, alert, oriented Speech fluent, appropriate CN grossly intact 5/5 BUE/BLE Wound c/d/i Neck soft, trachea midline Incision clean dry and intact Normal phonation Swallowing easily  Disposition: Home, stable   Allergies as of 09/23/2020      Reactions   Zofran [ondansetron Hcl]    Doesn't work      Medication List    STOP taking these medications   acetaminophen 325 MG tablet Commonly known as: TYLENOL   oxyCODONE-acetaminophen 5-325 MG tablet Commonly known as: PERCOCET/ROXICET     TAKE these medications   cyclobenzaprine 10 MG  tablet Commonly known as: FLEXERIL Take 10 mg by mouth 3 (three) times daily as needed for muscle spasms.   docusate sodium 100 MG capsule Commonly known as: COLACE Take 1 capsule (100 mg total) by mouth 2 (two) times daily. What changed: when to take this   HYDROcodone-acetaminophen 5-325 MG tablet Commonly known as: NORCO/VICODIN Take 1 tablet by mouth 2 (two) times daily as needed for moderate pain.   metFORMIN 500 MG tablet Commonly known as: GLUCOPHAGE Take 500 mg by mouth daily.   methylPREDNISolone 4 MG Tbpk tablet Commonly known as: MEDROL DOSEPAK Chris Shenoy   polyethylene glycol 17 g packet Commonly known as: MIRALAX / GLYCOLAX Take 17 g by mouth daily.   rosuvastatin 5 MG tablet Commonly known as: CRESTOR Take 5 mg by mouth daily.   Vyvanse 70 MG capsule Generic drug: lisdexamfetamine Take 70 mg by mouth daily as needed (before work).       Follow-up Information    09/25/2020, MD Follow up.   Specialty: Neurosurgery Why: already has appointment Contact information: [address] [place] [phone]               Signed: [phone] Burr Soffer 09/23/2020, 10:40 AM

## 2020-09-23 NOTE — Progress Notes (Signed)
Nsg Discharge Note  Admit Date:  09/21/2020 Discharge date: 09/23/2020   Russell Gillespie to be D/C'd  per MD order. Patient/caregiver able to verbalize understanding.  Discharge Medication: Allergies as of 09/23/2020      Reactions   Zofran [ondansetron Hcl] Other (See Comments)   Doesn't work      Medication List    STOP taking these medications   acetaminophen 325 MG tablet Commonly known as: TYLENOL   oxyCODONE-acetaminophen 5-325 MG tablet Commonly known as: PERCOCET/ROXICET     TAKE these medications   cyclobenzaprine 10 MG tablet Commonly known as: FLEXERIL Take 10 mg by mouth 3 (three) times daily as needed for muscle spasms.   docusate sodium 100 MG capsule Commonly known as: COLACE Take 1 capsule (100 mg total) by mouth 2 (two) times daily. What changed: when to take this   HYDROcodone-acetaminophen 5-325 MG tablet Commonly known as: NORCO/VICODIN Take 1 tablet by mouth 2 (two) times daily as needed for moderate pain.   metFORMIN 500 MG tablet Commonly known as: GLUCOPHAGE Take 500 mg by mouth daily.   methylPREDNISolone 4 MG Tbpk tablet Commonly known as: MEDROL DOSEPAK Russell Gillespie   polyethylene glycol 17 g packet Commonly known as: MIRALAX / GLYCOLAX Take 17 g by mouth daily.   rosuvastatin 5 MG tablet Commonly known as: CRESTOR Take 5 mg by mouth daily.   Vyvanse 70 MG capsule Generic drug: lisdexamfetamine Take 70 mg by mouth daily as needed (before work).       Discharge Assessment: Vitals:   09/23/20 0804 09/23/20 1142  BP: (!) 148/88 (!) 141/96  Pulse: (!) 56 69  Resp: 18 18  Temp: 97.9 F (36.6 C) 98.6 F (37 C)  SpO2: 96% 96%   Skin clean, dry and intact without evidence of skin break down, no evidence of skin tears noted. IV catheter discontinued intact. Site without signs and symptoms of complications - no redness or edema noted at insertion site, patient denies c/o pain - only slight tenderness at site.  Dressing  with slight pressure applied.  D/c Instructions-Education: Discharge instructions given to patient/family with verbalized understanding. D/c education completed with patient/family including follow up instructions, medication list, d/c activities limitations if indicated, with other d/c instructions as indicated by MD - patient able to verbalize understanding, all questions fully answered. Patient instructed to return to ED, call 911, or call MD for any changes in condition.  Patient escorted via WC, and D/C home via private auto.  Russell Gillespie, Tilford Pillar, RN 09/23/2020 11:45 AM

## 2020-09-28 ENCOUNTER — Telehealth: Payer: Self-pay

## 2020-09-28 NOTE — Telephone Encounter (Signed)
Faxed DC note to:    Bary Richard of [facility] Worker's Comp Phone- [phone] Main Number- [phone] Claim# [id number]

## 2021-04-15 ENCOUNTER — Telehealth: Payer: Self-pay | Admitting: Family

## 2021-04-15 NOTE — Telephone Encounter (Signed)
Called to discuss with patient about COVID-19 symptoms and the use of one of the available treatments for those with mild to moderate Covid symptoms and at a high risk of hospitalization.  Pt appears to qualify for outpatient treatment due to co-morbid conditions and/or a member of an at-risk group in accordance with the FDA Emergency Use Authorization.    Symptom onset: unknown [facility] Vaccinated: uknown Booster? unknown Immunocompromised? no Qualifiers: DM2, CV NIH Criteria: 4  Unable to reach pt - Left VM   Russell Gillespie

## 2021-04-17 ENCOUNTER — Other Ambulatory Visit: Payer: Self-pay | Admitting: Nurse Practitioner

## 2021-04-17 DIAGNOSIS — E119 Type 2 diabetes mellitus without complications: Secondary | ICD-10-CM

## 2021-04-17 DIAGNOSIS — U071 COVID-19: Secondary | ICD-10-CM

## 2021-04-17 NOTE — Progress Notes (Signed)
I connected by phone with Russell Gillespie on 04/17/2021 at 9:57 AM to discuss the potential use of a new treatment for mild to moderate COVID-19 viral infection in non-hospitalized patients.  This patient is a 60 y.o. male that meets the FDA criteria for Emergency Use Authorization of COVID monoclonal antibody bebtelovimab.  Has a (+) direct SARS-CoV-2 viral test result  Has mild or moderate COVID-19   Is NOT hospitalized due to COVID-19  Is within 10 days of symptom onset  Has at least one of the high risk factor(s) for progression to severe COVID-19 and/or hospitalization as defined in EUA.  Specific high risk criteria : BMI > 25 and Diabetes   I have spoken and communicated the following to the patient or parent/caregiver regarding COVID monoclonal antibody treatment:  1. FDA has authorized the emergency use for the treatment of mild to moderate COVID-19 in adults and pediatric patients with positive results of direct SARS-CoV-2 viral testing who are 15 years of age and older weighing at least 40 kg, and who are at high risk for progressing to severe COVID-19 and/or hospitalization.  2. The significant known and potential risks and benefits of COVID monoclonal antibody, and the extent to which such potential risks and benefits are unknown.  3. Information on available alternative treatments and the risks and benefits of those alternatives, including clinical trials.  4. Patients treated with COVID monoclonal antibody should continue to self-isolate and use infection control measures (e.g., wear mask, isolate, social distance, avoid sharing personal items, clean and disinfect "high touch" surfaces, and frequent handwashing) according to CDC guidelines.   5. The patient or parent/caregiver has the option to accept or refuse COVID monoclonal antibody treatment.  6. Discussion about the monoclonal antibody infusion does not ensure treatment. The patient will be placed on a list and  scheduled according to risk, symptom onset and availability. A scheduler will reach to the patient to let them know if we can accommodate their infusion or not.  After reviewing this information with the patient, the patient has agreed to receive one of the available covid 19 monoclonal antibodies and will be provided an appropriate fact sheet prior to infusion. [name], NP 04/17/2021 9:57 AM

## 2021-04-19 ENCOUNTER — Ambulatory Visit (INDEPENDENT_AMBULATORY_CARE_PROVIDER_SITE_OTHER): Payer: 59

## 2021-04-19 ENCOUNTER — Telehealth: Payer: Self-pay

## 2021-04-19 DIAGNOSIS — U071 COVID-19: Secondary | ICD-10-CM

## 2021-04-19 DIAGNOSIS — E119 Type 2 diabetes mellitus without complications: Secondary | ICD-10-CM

## 2021-04-19 MED ORDER — ALBUTEROL SULFATE HFA 108 (90 BASE) MCG/ACT IN AERS: 2.0000 | INHALATION_SPRAY | Freq: Once | RESPIRATORY_TRACT | Status: AC | PRN

## 2021-04-19 MED ORDER — DIPHENHYDRAMINE HCL 50 MG/ML IJ SOLN: 50.0000 mg | Freq: Once | INTRAMUSCULAR | Status: AC | PRN

## 2021-04-19 MED ORDER — EPINEPHRINE 0.3 MG/0.3ML IJ SOAJ: 0.3000 mg | Freq: Once | INTRAMUSCULAR | Status: AC | PRN

## 2021-04-19 MED ORDER — BEBTELOVIMAB 175 MG/2 ML IV (EUA)
175.0000 mg | Freq: Once | INTRAMUSCULAR | Status: AC
  Administered 2021-04-19: 175 mg via INTRAVENOUS

## 2021-04-19 MED ORDER — SODIUM CHLORIDE 0.9 % IV SOLN: INTRAVENOUS | Status: AC | PRN

## 2021-04-19 MED ORDER — METHYLPREDNISOLONE SODIUM SUCC 125 MG IJ SOLR: 125.0000 mg | Freq: Once | INTRAMUSCULAR | Status: AC | PRN

## 2021-04-19 MED ORDER — FAMOTIDINE IN NACL 20-0.9 MG/50ML-% IV SOLN: 20.0000 mg | Freq: Once | INTRAVENOUS | Status: AC | PRN

## 2021-04-19 NOTE — Progress Notes (Signed)
Diagnosis: COVID  Provider:  Chilton Greathouse, MD  Procedure: Infusion  IV Type: Peripheral, IV Location: R Forearm  Bebtelovimab, Dose: 175mg   Infusion Start Time: 1451  Infusion Stop Time: 1452  Post Infusion IV Care: Observation period completed and Peripheral IV Discontinued  Discharge: Condition: Good, Destination: Home . AVS provided to patient.   Performed by:  , RN

## 2021-04-19 NOTE — Patient Instructions (Signed)
10 Things You Can Do to Manage Your COVID-19 Symptoms at Home If you have possible or confirmed COVID-19: 1. Stay home except to get medical care. 2. Monitor your symptoms carefully. If your symptoms get worse, call your healthcare provider immediately. 3. Get rest and stay hydrated. 4. If you have a medical appointment, call the healthcare provider ahead of time and tell them that you have or may have COVID-19. 5. For medical emergencies, call 911 and notify the dispatch personnel that you have or may have COVID-19. 6. Cover your cough and sneezes with a tissue or use the inside of your elbow. 7. Wash your hands often with soap and water for at least 20 seconds or clean your hands with an alcohol-based hand sanitizer that contains at least 60% alcohol. 8. As much as possible, stay in a specific room and away from other people in your home. Also, you should use a separate bathroom, if available. If you need to be around other people in or outside of the home, wear a mask. 9. Avoid sharing personal items with other people in your household, like dishes, towels, and bedding. 10. Clean all surfaces that are touched often, like counters, tabletops, and doorknobs. Use household cleaning sprays or wipes according to the label instructions. cdc.gov/coronavirus 06/25/2020 This information is not intended to replace advice given to you by your health care provider. Make sure you discuss any questions you have with your health care provider. Document Revised: 10/11/2020 Document Reviewed: 10/11/2020 Elsevier Patient Education  2021 Elsevier Inc.  What types of side effects do monoclonal antibody drugs cause?  Common side effects  In general, the more common side effects caused by monoclonal antibody drugs include: . Allergic reactions, such as hives or itching . Flu-like signs and symptoms, including chills, fatigue, fever, and muscle aches and pains . Nausea, vomiting . Diarrhea . Skin  rashes . Low blood pressure   The CDC is recommending patients who receive monoclonal antibody treatments wait at least 90 days before being vaccinated.  Currently, there are no data on the safety and efficacy of mRNA COVID-19 vaccines in persons who received monoclonal antibodies or convalescent plasma as part of COVID-19 treatment. Based on the estimated half-life of such therapies as well as evidence suggesting that reinfection is uncommon in the 90 days after initial infection, vaccination should be deferred for at least 90 days, as a precautionary measure until additional information becomes available, to avoid interference of the antibody treatment with vaccine-induced immune responses.   If someone you know is interested in receiving treatment please have them contact their MD for a referral or visit [email or web address]    

## 2021-04-19 NOTE — Telephone Encounter (Signed)
Reviewed [facility]AB Estimate of $1050 with patient.  Stated they would like to proceed.   

## 2021-08-26 ENCOUNTER — Ambulatory Visit
Admission: RE | Admit: 2021-08-26 | Discharge: 2021-08-26 | Disposition: A | Discharge status: Home / Self Care | Payer: Worker's Compensation | Source: Ambulatory Visit | Attending: Nurse Practitioner | Admitting: Nurse Practitioner

## 2021-08-26 ENCOUNTER — Other Ambulatory Visit: Payer: Self-pay

## 2021-08-26 ENCOUNTER — Other Ambulatory Visit: Payer: Self-pay | Admitting: Nurse Practitioner

## 2021-08-26 DIAGNOSIS — R52 Pain, unspecified: Secondary | ICD-10-CM
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
## 2022-03-21 ENCOUNTER — Other Ambulatory Visit: Payer: Self-pay | Admitting: Orthopedic Surgery

## 2022-03-21 DIAGNOSIS — M545 Low back pain, unspecified: Secondary | ICD-10-CM

## 2022-03-22 ENCOUNTER — Other Ambulatory Visit: Payer: Self-pay | Admitting: Orthopedic Surgery

## 2022-03-22 DIAGNOSIS — R1032 Left lower quadrant pain: Secondary | ICD-10-CM

## 2022-04-10 ENCOUNTER — Ambulatory Visit
Admission: RE | Admit: 2022-04-10 | Discharge: 2022-04-10 | Disposition: A | Discharge status: Home / Self Care | Payer: 59 | Source: Ambulatory Visit | Attending: Orthopedic Surgery | Admitting: Orthopedic Surgery

## 2022-04-10 ENCOUNTER — Ambulatory Visit
Admission: RE | Admit: 2022-04-10 | Discharge: 2022-04-10 | Disposition: A | Discharge status: Home / Self Care | Payer: Worker's Compensation | Source: Ambulatory Visit | Attending: Nurse Practitioner | Admitting: Nurse Practitioner

## 2022-04-10 ENCOUNTER — Other Ambulatory Visit: Payer: Self-pay | Admitting: Nurse Practitioner

## 2022-04-10 DIAGNOSIS — R1032 Left lower quadrant pain: Secondary | ICD-10-CM

## 2022-04-10 DIAGNOSIS — M25561 Pain in right knee: Secondary | ICD-10-CM

## 2024-01-15 ENCOUNTER — Emergency Department (HOSPITAL_COMMUNITY): Admission: EM | Admit: 2024-01-15 | Discharge: 2024-01-15 | Discharge status: Against Medical Advice | Payer: Self-pay

## 2024-01-15 NOTE — ED Triage Notes (Signed)
 Pt BIB [facility] for chest pain 3/10 and syncopal episode at 1500 today, pt reports having joint replacement surgery today in his thumb, syncopal episode at home today and continues to feel faint, called surgeon who suggested to come to ED, NS on 12 lead, v/s en route 137/88, HR 71, RR15, 96% RA

## 2024-07-31 ENCOUNTER — Encounter (HOSPITAL_BASED_OUTPATIENT_CLINIC_OR_DEPARTMENT_OTHER): Payer: Self-pay | Admitting: Emergency Medicine

## 2024-07-31 ENCOUNTER — Other Ambulatory Visit (HOSPITAL_BASED_OUTPATIENT_CLINIC_OR_DEPARTMENT_OTHER): Payer: Self-pay

## 2024-07-31 ENCOUNTER — Other Ambulatory Visit: Payer: Self-pay

## 2024-07-31 ENCOUNTER — Emergency Department (HOSPITAL_BASED_OUTPATIENT_CLINIC_OR_DEPARTMENT_OTHER)
Admission: EM | Admit: 2024-07-31 | Discharge: 2024-07-31 | Disposition: A | Discharge status: Home / Self Care | Attending: Emergency Medicine | Admitting: Emergency Medicine

## 2024-07-31 ENCOUNTER — Emergency Department (HOSPITAL_BASED_OUTPATIENT_CLINIC_OR_DEPARTMENT_OTHER)

## 2024-07-31 DIAGNOSIS — I4891 Unspecified atrial fibrillation: Secondary | ICD-10-CM | POA: Insufficient documentation

## 2024-07-31 DIAGNOSIS — Z7984 Long term (current) use of oral hypoglycemic drugs: Secondary | ICD-10-CM | POA: Insufficient documentation

## 2024-07-31 DIAGNOSIS — N189 Chronic kidney disease, unspecified: Secondary | ICD-10-CM | POA: Diagnosis not present

## 2024-07-31 DIAGNOSIS — Z79899 Other long term (current) drug therapy: Secondary | ICD-10-CM | POA: Insufficient documentation

## 2024-07-31 DIAGNOSIS — I129 Hypertensive chronic kidney disease with stage 1 through stage 4 chronic kidney disease, or unspecified chronic kidney disease: Secondary | ICD-10-CM | POA: Insufficient documentation

## 2024-07-31 DIAGNOSIS — E1122 Type 2 diabetes mellitus with diabetic chronic kidney disease: Secondary | ICD-10-CM | POA: Insufficient documentation

## 2024-07-31 DIAGNOSIS — Z7901 Long term (current) use of anticoagulants: Secondary | ICD-10-CM | POA: Insufficient documentation

## 2024-07-31 DIAGNOSIS — R000 Tachycardia, unspecified: Secondary | ICD-10-CM | POA: Diagnosis present

## 2024-07-31 DIAGNOSIS — E1165 Type 2 diabetes mellitus with hyperglycemia: Secondary | ICD-10-CM | POA: Insufficient documentation

## 2024-07-31 LAB — BASIC METABOLIC PANEL WITH GFR
Anion gap: 12 (ref 5–15)
BUN: 10 mg/dL (ref 8–23)
CO2: 21 mmol/L — ABNORMAL LOW (ref 22–32)
Calcium: 9.1 mg/dL (ref 8.9–10.3)
Chloride: 104 mmol/L (ref 98–111)
Creatinine, Ser: 0.85 mg/dL (ref 0.61–1.24)
GFR, Estimated: >60 mL/min (ref >60)
Glucose, Bld: 169 mg/dL — ABNORMAL HIGH (ref 70–99)
Potassium: 4 mmol/L (ref 3.5–5.1)
Sodium: 137 mmol/L (ref 135–145)

## 2024-07-31 LAB — CBC
HCT: 38.9 % — ABNORMAL LOW (ref 39.0–52.0)
Hemoglobin: 13.4 g/dL (ref 13.0–17.0)
MCH: 31.1 pg (ref 26.0–34.0)
MCHC: 34.4 g/dL (ref 30.0–36.0)
MCV: 90.3 fL (ref 80.0–100.0)
Platelets: 235 K/uL (ref 150–400)
RBC: 4.31 MIL/uL (ref 4.22–5.81)
RDW: 12.4 % (ref 11.5–15.5)
WBC: 6.8 K/uL (ref 4.0–10.5)
nRBC: 0 % (ref 0.0–0.2)

## 2024-07-31 LAB — LACTIC ACID, PLASMA: Lactic Acid, Venous: 1.7 mmol/L (ref 0.5–1.9)

## 2024-07-31 LAB — MAGNESIUM: Magnesium: 2.1 mg/dL (ref 1.7–2.4)

## 2024-07-31 MED ORDER — APIXABAN (ELIQUIS) VTE STARTER PACK (10MG AND 5MG)
ORAL_TABLET | ORAL | 0 refills | Status: DC
  Filled 2024-07-31 (×2): qty 74, 30d supply, fill #0

## 2024-07-31 MED ORDER — DILTIAZEM HCL 25 MG/5ML IV SOLN
20.0000 mg | Freq: Once | INTRAVENOUS | Status: AC
  Administered 2024-07-31: 20 mg via INTRAVENOUS
  Filled 2024-07-31: qty 5

## 2024-07-31 MED ORDER — APIXABAN (ELIQUIS) VTE STARTER PACK (10MG AND 5MG): ORAL_TABLET | ORAL | 0 refills | Status: DC

## 2024-07-31 NOTE — ED Triage Notes (Signed)
 Pt c/o SOB, tachycardia and bilateral neck pain that started about an hour ago. Also feeling dizzy and strange. Being treated for possible periodontic infection on upper R tooth Hx irregular heart beat

## 2024-07-31 NOTE — ED Provider Notes (Signed)
 [place] EMERGENCY DEPARTMENT AT [facility] Provider Note   CSN: [id number] Arrival date & time: 07/31/24  1422     Patient presents with: Tachycardia and Shortness of Breath  HPI Russell Gillespie is a 63 y.o. male with history of irregular heartbeat, HTN, DM 2, CKD presenting for shortness of breath and palpitations.  He states his symptoms started about an hour ago.  Denies chest pain. States he was feeling lightheaded and strange.  Also recently started on amoxicillin for periodonic infection in the right upper tooth. He states he does have some pain in that area that extends to the right jaw.    Shortness of Breath      Prior to Admission medications   Medication Sig Start Date End Date Taking? Authorizing Provider  amoxicillin (AMOXIL) 500 MG capsule Take by mouth. 07/24/24  Yes [provider]  hydrocortisone (ANUSOL-HC) 25 MG suppository Place 25 mg rectally 2 (two) times daily. 07/31/24  Yes [provider]  meloxicam (MOBIC) 15 MG tablet Take 15 mg by mouth daily. 07/28/24  Yes [provider]  APIXABAN  (ELIQUIS ) VTE STARTER PACK (10MG  AND 5MG ) Take as directed on package: start with two-5mg  tablets twice daily for 7 days. On day 8, switch to one-5mg  tablet twice daily. 07/31/24   Brenson Hartman K, PA-C  cyclobenzaprine  (FLEXERIL ) 10 MG tablet Take 10 mg by mouth 3 (three) times daily as needed for muscle spasms.    [provider]  docusate sodium  (COLACE) 100 MG capsule Take 1 capsule (100 mg total) by mouth 2 (two) times daily. Patient taking differently: Take 100 mg by mouth at bedtime.  09/26/19   Mavis Purchase, MD  HYDROcodone -acetaminophen  (NORCO/VICODIN) 5-325 MG tablet Take 1 tablet by mouth 2 (two) times daily as needed for moderate pain.  09/15/20   [provider]  metFORMIN  (GLUCOPHAGE ) 500 MG tablet Take 500 mg by mouth daily.    [provider]  polyethylene glycol (MIRALAX  / GLYCOLAX ) 17 g packet  Take 17 g by mouth daily.    [provider]  rosuvastatin  (CRESTOR ) 5 MG tablet Take 5 mg by mouth daily.    [provider]  telmisartan (MICARDIS) 20 MG tablet Take 20 mg by mouth daily.    [provider]  VYVANSE  70 MG capsule Take 70 mg by mouth daily as needed (before work).  09/04/20   [provider]    Allergies: Zofran  [ondansetron  hcl]    Review of Systems  Respiratory:  Positive for shortness of breath.     Physical Exam   Vitals:   07/31/24 1530 07/31/24 1545  BP: 112/78 102/78  Pulse: 66 66  Resp: 10 11  Temp:    SpO2: 98% 97%    CONSTITUTIONAL:  well-appearing, NAD NEURO:  Alert and oriented x 3, CN 3-12 grossly intact EYES:  eyes equal and reactive ENT/NECK:  Supple, no stridor  CARDIO: Irregular rate and rhythm, appears well-perfused PULM:  No respiratory distress, CTAB GI/GU:  non-distended MSK/SPINE:  No gross deformities, no edema, moves all extremities  SKIN:  no rash, atraumatic  *Additional and/or pertinent findings included in MDM below  (all labs ordered are listed, but only abnormal results are displayed) Labs Reviewed  BASIC METABOLIC PANEL WITH GFR - Abnormal; Notable for the following components:      Result Value   CO2 21 (*)    Glucose, Bld 169 (*)    All other components within normal limits  CBC - Abnormal;  Notable for the following components:   HCT 38.9 (*)    All other components within normal limits  LACTIC ACID, PLASMA  MAGNESIUM    EKG: EKG Interpretation Date/Time:  Thursday July 31 2024 14:54:03 EDT Ventricular Rate:  61 PR Interval:  167 QRS Duration:  98 QT Interval:  368 QTC Calculation: 371 R Axis:   41  Text Interpretation: Sinus rhythm Atrial premature complex Confirmed by Midge Golas ([id number]) on 07/31/2024 2:59:16 PM  Radiology: [facility] Chest Port 1 View Result Date: 07/31/2024 CLINICAL DATA:  Tachycardia and bilateral neck pain. EXAM: PORTABLE CHEST 1 VIEW COMPARISON:   July 22, 2020 FINDINGS: The heart size and mediastinal contours are within normal limits. Both lungs are clear. Postoperative changes are seen within the lower cervical spine with multilevel degenerative changes noted throughout the thoracic spine. IMPRESSION: No active disease. Electronically Signed   By: Suzen Dials M.D.   On: 07/31/2024 15:13     .Critical Care  Performed by: Milaya Hora K, PA-C Authorized by: Lang Norleen POUR, PA-C   Critical care provider statement:    Critical care time (minutes):  30   Critical care time was exclusive of: afib with RVR requiring IV diltiazem .   Critical care was time spent personally by me on the following activities:  Development of treatment plan with patient or surrogate, discussions with consultants, evaluation of patient's response to treatment, examination of patient, ordering and review of laboratory studies, ordering and review of radiographic studies, ordering and performing treatments and interventions, pulse oximetry, re-evaluation of patient's condition and review of old charts    Medications Ordered in the ED  diltiazem  (CARDIZEM ) injection 20 mg (20 mg Intravenous Given 07/31/24 1450)            CHA2DS2-VASc Score: 1                        Medical Decision Making Amount and/or Complexity of Data Reviewed Labs: ordered. Radiology: ordered.  Risk Prescription drug management.   Initial Impression and Ddx 62 year old well-appearing male presenting for shortness of breath and palpitations.  Exam notable for irregular tachycardic rhythm.  DDx includes atrial fibrillation with RVR, other arrhythmia, sepsis, pneumonia, CHF exacerbation, PE, ACS, other. Patient PMH that increases complexity of ED encounter:  irregular heartbeat, CKD  Interpretation of Diagnostics - I independent reviewed and interpreted the labs as followed: mild hyperglycemia  - I independently visualized the following imaging with scope of interpretation  limited to determining acute life threatening conditions related to emergency care: CXR, which revealed no active disease  - I personally reviewed and interpreted EKG which revealed A-fib with RVR but no evidence of ischemia  Patient Reassessment and Ultimate Disposition/Management Immediately converted to sinus rhythm with a heart rate in the mid 60s after administration of 20 mg of IV diltiazem .  He states he is feeling much better.  Workup does not suggest sepsis.  CHA2DS2-VASc score is 1.  Ordered Eliquis  starter pack.  Advised him to go to the [facility] for education on starting Eliquis .  Also provided him a coupon for his Eliquis .  Submitted ambulatory referral to A-fib clinic.  Discused return precautions.  Discharged in good condition.  Patient management required discussion with the following services or consulting groups:  None  Complexity of Problems Addressed Acute complicated illness or Injury  Additional Data Reviewed and Analyzed Further history obtained from: Past medical history and medications listed in the EMR and Prior ED visit  notes  Patient Encounter Risk Assessment Consideration of hospitalization      Final diagnoses:  Atrial fibrillation with RVR [facility])    ED Discharge Orders          Ordered    Amb referral to AFIB Clinic        07/31/24 1438    APIXABAN  (ELIQUIS ) VTE STARTER PACK (10MG  AND 5MG )  Status:  Discontinued        07/31/24 1512    APIXABAN  (ELIQUIS ) VTE STARTER PACK (10MG  AND 5MG )       Note to Pharmacy: If starter pack unavailable, substitute with seventy-four 5 mg apixaban  tabs following the above SIG directions.   07/31/24 1615               Lang Norleen POUR, PA-C 07/31/24 1653    Midge Golas, MD 08/01/24 [phone]

## 2024-07-31 NOTE — ED Notes (Signed)
 Education given on eliquis  and atrial fib.  Patient know to go to [facility] pharmacy for oral anticoagulation consult.

## 2024-07-31 NOTE — Discharge Instructions (Addendum)
 Evaluation today revealed that you had atrial fibrillation with rapid ventricular response also known as a fast heart rate.  As we discussed you will need to follow-up in the atrial fibrillation clinic.  Also I am starting you on Eliquis  which is a blood thinner to lower risk of stroke associated with atrial fibrillation.  If you develop shortness of breath, palpitations, chest pain, dizziness or any other concerning symptom please return to the ED for further evaluation.  I sent your Eliquis  to the community pharmacy here at [facility].

## 2024-07-31 NOTE — ED Notes (Signed)
 Convert to NSR after Cardizem  push.  Repeat EKG done

## 2024-07-31 NOTE — ED Notes (Signed)
 One set Blood cultures drawn and sent to lab to hold

## 2024-08-01 ENCOUNTER — Other Ambulatory Visit (HOSPITAL_BASED_OUTPATIENT_CLINIC_OR_DEPARTMENT_OTHER): Payer: Self-pay

## 2024-08-01 ENCOUNTER — Telehealth (HOSPITAL_BASED_OUTPATIENT_CLINIC_OR_DEPARTMENT_OTHER): Payer: Self-pay | Admitting: Pharmacist

## 2024-08-01 NOTE — Telephone Encounter (Signed)
 Called patient this morning 08/01/24 to discuss Eliquis  prescription. Mr. Kenyon was prescribed an Eliquis  starter pack yesterday in the emergency room for Afib. This should have been for regular 5 mg BID dosing, instead of a starter pack that includes a loading dose. I advised Mr. Wilensky to only take 1 tablet (5 mg) twice daily. He stated he had already taken two doses of 2 tablets (10 mg) since yesterday. Patient agreed to new dosing regimen.   Norlene Sor, PharmD Pharmacy Supervisor [facility]: [phone]  08/01/2024 11:14 AM

## 2024-08-12 ENCOUNTER — Other Ambulatory Visit (HOSPITAL_BASED_OUTPATIENT_CLINIC_OR_DEPARTMENT_OTHER): Payer: Self-pay

## 2024-08-15 ENCOUNTER — Ambulatory Visit (HOSPITAL_COMMUNITY)
Admission: RE | Admit: 2024-08-15 | Discharge: 2024-08-15 | Disposition: A | Discharge status: Home / Self Care | Source: Ambulatory Visit | Attending: Internal Medicine | Admitting: Internal Medicine

## 2024-08-15 ENCOUNTER — Other Ambulatory Visit (HOSPITAL_BASED_OUTPATIENT_CLINIC_OR_DEPARTMENT_OTHER): Payer: Self-pay

## 2024-08-15 ENCOUNTER — Inpatient Hospital Stay (HOSPITAL_COMMUNITY)
Admission: RE | Admit: 2024-08-15 | Discharge: 2024-08-15 | Disposition: A | Discharge status: Home / Self Care | Source: Ambulatory Visit | Attending: Internal Medicine | Admitting: Internal Medicine

## 2024-08-15 ENCOUNTER — Encounter (HOSPITAL_COMMUNITY): Payer: Self-pay | Admitting: Internal Medicine

## 2024-08-15 ENCOUNTER — Other Ambulatory Visit (HOSPITAL_COMMUNITY): Payer: Self-pay

## 2024-08-15 VITALS — BP 130/78 | HR 57 | Ht 73.0 in | Wt 226.2 lb

## 2024-08-15 DIAGNOSIS — I4891 Unspecified atrial fibrillation: Secondary | ICD-10-CM

## 2024-08-15 DIAGNOSIS — I480 Paroxysmal atrial fibrillation: Secondary | ICD-10-CM

## 2024-08-15 DIAGNOSIS — D6869 Other thrombophilia: Secondary | ICD-10-CM | POA: Diagnosis not present

## 2024-08-15 MED ORDER — APIXABAN 5 MG PO TABS
5.0000 mg | ORAL_TABLET | Freq: Two times a day (BID) | ORAL | 3 refills | Status: DC
  Filled 2024-08-15 – 2024-09-06 (×2): qty 60, 30d supply, fill #0
  Filled 2024-10-02: qty 60, 30d supply, fill #1
  Filled 2024-11-03: qty 60, 30d supply, fill #2
  Filled 2024-12-01: qty 60, 30d supply, fill #3

## 2024-08-15 NOTE — Progress Notes (Signed)
 Primary Care Physician: Seabron Lenis, MD Primary Cardiologist: None Electrophysiologist: None     Referring Physician: ED     Russell Gillespie is a 63 y.o. male with a history of HTN, CKD, T2DM, and atrial fibrillation who presents for consultation in the [facility]. ED visit on 8/21 for tachycardia and SOB found to be in new Afib; recently started on amoxicillin for tooth infection. Patient converted to NSR after IV diltiazem . Patient is on Eliquis  5 mg BID for a CHADS2VASC score of 2.  On evaluation today, patient is currently in NSR. He wears a Fitbit watch and it did not alert him on day of episode that he was in Afib; he has noted from it having elevated HR in the evening or while asleep. He drinks one cup of coffee daily. He has reduced his alcohol intake since Afib episode. He is currently taking Eliquis  5 mg BID. He snores at times per wife.  Today, he denies symptoms of chest pain, shortness of breath, orthopnea, PND, lower extremity edema, dizziness, presyncope, syncope, snoring, daytime somnolence, bleeding, or neurologic sequela. The patient is tolerating medications without difficulties and is otherwise without complaint today.    Atrial Fibrillation Risk Factors:  he does have symptoms or diagnosis of sleep apnea.   he has a BMI of Body mass index is 29.84 kg/m.SABRA Filed Weights   08/15/24 0908  Weight: 102.6 kg    Current Outpatient Medications  Medication Sig Dispense Refill   apixaban  (ELIQUIS ) 5 MG TABS tablet Take 1 tablet (5 mg total) by mouth 2 (two) times daily. 60 tablet 3   cyclobenzaprine  (FLEXERIL ) 10 MG tablet Take 10 mg by mouth 3 (three) times daily as needed for muscle spasms.     docusate sodium  (COLACE) 100 MG capsule Take 1 capsule (100 mg total) by mouth 2 (two) times daily. (Patient taking differently: Take 100 mg by mouth at bedtime. ) 60 capsule 0   HYDROcodone -acetaminophen  (NORCO/VICODIN) 5-325 MG tablet  Take 1 tablet by mouth 2 (two) times daily as needed for moderate pain.      hydrocortisone (ANUSOL-HC) 25 MG suppository Place 25 mg rectally 2 (two) times daily.     meloxicam (MOBIC) 15 MG tablet Take 15 mg by mouth daily.     metFORMIN  (GLUCOPHAGE ) 500 MG tablet Take 500 mg by mouth daily.     methocarbamol  (ROBAXIN ) 500 MG tablet 500 mg every 6 (six) hours as needed for muscle spasms.     penicillin v potassium (VEETID) 500 MG tablet Take 500 mg by mouth 4 (four) times daily.     polyethylene glycol (MIRALAX  / GLYCOLAX ) 17 g packet Take 17 g by mouth daily.     rosuvastatin  (CRESTOR ) 5 MG tablet Take 5 mg by mouth daily.     telmisartan (MICARDIS) 20 MG tablet Take 20 mg by mouth daily.     VYVANSE  70 MG capsule Take 70 mg by mouth daily as needed (before work).      Current Facility-Administered Medications  Medication Dose Route Frequency Provider Last Rate Last Admin   0.9 %  sodium chloride  infusion   Intravenous PRN Vivienne Lonni Ingle, NP        Atrial Fibrillation Management history:  Previous antiarrhythmic drugs: none Previous cardioversions: none Previous ablations: none Anticoagulation history: Eliquis    ROS- All systems are reviewed and negative except as per the HPI above.  Physical Exam: BP 130/78   Pulse (!) 57   Ht  6' 1 (1.854 m)   Wt 102.6 kg   BMI 29.84 kg/m   GEN: Well nourished, well developed in no acute distress NECK: No JVD; No carotid bruits CARDIAC: Regular rate and rhythm, no murmurs, rubs, gallops RESPIRATORY:  Clear to auscultation without rales, wheezing or rhonchi  ABDOMEN: Soft, non-tender, non-distended EXTREMITIES:  No edema; No deformity   EKG today demonstrates  Vent. rate 57 BPM PR interval 142 ms QRS duration 86 ms QT/QTcB 402/391 ms P-R-T axes 46 57 45 Sinus bradycardia with Premature atrial complexes in a pattern of bigeminy Otherwise normal ECG When compared with ECG of 31-Jul-2024 14:54, PREVIOUS ECG IS  PRESENT  Echo N/A  ASSESSMENT & PLAN CHA2DS2-VASc Score = 2  The patient's score is based upon: CHF History: 0 HTN History: 1 Diabetes History: 1 Stroke History: 0 Vascular Disease History: 0 Age Score: 0 Gender Score: 0       ASSESSMENT AND PLAN: Paroxysmal Atrial Fibrillation (ICD10:  I48.0) The patient's CHA2DS2-VASc score is 2, indicating a 2.2% annual risk of stroke.    Patient is currently in NSR. Education provided about Afib. Discussion about triggers for Afib and medication treatments and ablation going forward if indicated. After discussion, we will proceed with conservative observation at this time. Rhythm monitoring device recommended in addition to Fitbit watch. Will order sleep study. Will order baseline echocardiogram. Will place 2 week Zio monitor to assess Afib burden. He had an Afib episode on 8/21 that appears to have been strictly noted for duration of less than 6 hours. Due to this, he would be able to temporarily hold his Eliquis  for tooth extraction by dentist as soon as able to go through with procedure.   Secondary Hypercoagulable State (ICD10:  D68.69) The patient is at significant risk for stroke/thromboembolism based upon his CHA2DS2-VASc Score of 2.  Continue Apixaban  (Eliquis ).  We discussed the reasoning behind anticoagulation in the setting of stroke prevention related to Afib. We discussed the benefits vs risks of anticoagulation. After discussion, patient would like to continue anticoagulation and understands the potential risks. He will remain on Eliquis  5 mg BID. Will check CBC in 1 month.    Follow up will be based on monitor results.   Terra Pac, [facility]  Afib Clinic [address] [place], [place] [address] [phone]

## 2024-08-15 NOTE — Patient Instructions (Addendum)
 Take Eliquis  5 mg twice a day  May use [employer] - 1 lead for monitoring device   Echocardiogram and Sleep study -- scheduling will call once insurance authorization received.   Dentist may fax us  at [phone]  Wear monitor for 14 days take off 08/29/24 put in box and place in mailbox for mail

## 2024-08-25 ENCOUNTER — Other Ambulatory Visit (HOSPITAL_BASED_OUTPATIENT_CLINIC_OR_DEPARTMENT_OTHER): Payer: Self-pay

## 2024-09-03 ENCOUNTER — Ambulatory Visit (HOSPITAL_COMMUNITY): Payer: Self-pay | Admitting: Internal Medicine

## 2024-09-06 ENCOUNTER — Other Ambulatory Visit (HOSPITAL_BASED_OUTPATIENT_CLINIC_OR_DEPARTMENT_OTHER): Payer: Self-pay

## 2024-09-18 ENCOUNTER — Ambulatory Visit (HOSPITAL_COMMUNITY): Payer: Self-pay | Admitting: Internal Medicine

## 2024-09-18 ENCOUNTER — Ambulatory Visit (HOSPITAL_COMMUNITY)
Admission: RE | Admit: 2024-09-18 | Discharge: 2024-09-18 | Disposition: A | Discharge status: Home / Self Care | Source: Ambulatory Visit | Attending: Cardiology | Admitting: Cardiology

## 2024-09-18 DIAGNOSIS — I480 Paroxysmal atrial fibrillation: Secondary | ICD-10-CM | POA: Diagnosis present

## 2024-09-18 DIAGNOSIS — I4891 Unspecified atrial fibrillation: Secondary | ICD-10-CM

## 2024-09-18 DIAGNOSIS — D6869 Other thrombophilia: Secondary | ICD-10-CM | POA: Insufficient documentation

## 2024-09-18 LAB — ECHOCARDIOGRAM COMPLETE
Area-P 1/2: 2.91 cm2
S' Lateral: 3.4 cm

## 2024-09-25 ENCOUNTER — Other Ambulatory Visit (HOSPITAL_BASED_OUTPATIENT_CLINIC_OR_DEPARTMENT_OTHER): Payer: Self-pay | Admitting: Nurse Practitioner

## 2024-09-25 ENCOUNTER — Ambulatory Visit (HOSPITAL_BASED_OUTPATIENT_CLINIC_OR_DEPARTMENT_OTHER)
Admission: RE | Admit: 2024-09-25 | Discharge: 2024-09-25 | Disposition: A | Discharge status: Home / Self Care | Source: Ambulatory Visit | Attending: Nurse Practitioner | Admitting: Nurse Practitioner

## 2024-09-25 DIAGNOSIS — M546 Pain in thoracic spine: Secondary | ICD-10-CM | POA: Insufficient documentation

## 2024-10-16 ENCOUNTER — Other Ambulatory Visit: Payer: Self-pay | Admitting: Nurse Practitioner

## 2024-10-16 DIAGNOSIS — M5414 Radiculopathy, thoracic region: Secondary | ICD-10-CM

## 2024-10-22 ENCOUNTER — Encounter: Payer: Self-pay | Admitting: Nurse Practitioner

## 2024-10-27 ENCOUNTER — Ambulatory Visit
Admission: RE | Admit: 2024-10-27 | Discharge: 2024-10-27 | Disposition: A | Discharge status: Home / Self Care | Source: Ambulatory Visit | Attending: Nurse Practitioner | Admitting: Nurse Practitioner

## 2024-10-27 DIAGNOSIS — M5414 Radiculopathy, thoracic region: Secondary | ICD-10-CM

## 2024-12-25 ENCOUNTER — Emergency Department (HOSPITAL_BASED_OUTPATIENT_CLINIC_OR_DEPARTMENT_OTHER)
Admission: EM | Admit: 2024-12-25 | Discharge: 2024-12-25 | Disposition: A | Discharge status: Home / Self Care | Attending: Emergency Medicine | Admitting: Emergency Medicine

## 2024-12-25 ENCOUNTER — Other Ambulatory Visit (HOSPITAL_BASED_OUTPATIENT_CLINIC_OR_DEPARTMENT_OTHER): Payer: Self-pay

## 2024-12-25 ENCOUNTER — Encounter (HOSPITAL_BASED_OUTPATIENT_CLINIC_OR_DEPARTMENT_OTHER): Payer: Self-pay

## 2024-12-25 ENCOUNTER — Emergency Department (HOSPITAL_BASED_OUTPATIENT_CLINIC_OR_DEPARTMENT_OTHER)

## 2024-12-25 ENCOUNTER — Other Ambulatory Visit: Payer: Self-pay

## 2024-12-25 DIAGNOSIS — N189 Chronic kidney disease, unspecified: Secondary | ICD-10-CM | POA: Diagnosis not present

## 2024-12-25 DIAGNOSIS — Z96641 Presence of right artificial hip joint: Secondary | ICD-10-CM | POA: Diagnosis not present

## 2024-12-25 DIAGNOSIS — M545 Low back pain, unspecified: Secondary | ICD-10-CM | POA: Insufficient documentation

## 2024-12-25 DIAGNOSIS — Z96652 Presence of left artificial knee joint: Secondary | ICD-10-CM | POA: Insufficient documentation

## 2024-12-25 DIAGNOSIS — G8929 Other chronic pain: Secondary | ICD-10-CM | POA: Insufficient documentation

## 2024-12-25 DIAGNOSIS — Z7901 Long term (current) use of anticoagulants: Secondary | ICD-10-CM | POA: Insufficient documentation

## 2024-12-25 DIAGNOSIS — W108XXA Fall (on) (from) other stairs and steps, initial encounter: Secondary | ICD-10-CM | POA: Diagnosis not present

## 2024-12-25 MED ORDER — ACETAMINOPHEN 500 MG PO TABS
1000.0000 mg | ORAL_TABLET | Freq: Once | ORAL | Status: AC
  Administered 2024-12-25: 1000 mg via ORAL
  Filled 2024-12-25: qty 2

## 2024-12-25 MED ORDER — FAMOTIDINE 20 MG PO TABS
20.0000 mg | ORAL_TABLET | Freq: Two times a day (BID) | ORAL | 0 refills | Status: AC
  Filled 2024-12-25: qty 30, 15d supply, fill #0

## 2024-12-25 MED ORDER — LIDOCAINE 5 % EX PTCH
1.0000 | MEDICATED_PATCH | CUTANEOUS | 0 refills | Status: AC
  Filled 2024-12-25: qty 30, 30d supply, fill #0

## 2024-12-25 MED ORDER — METHYLPREDNISOLONE 4 MG PO TBPK
ORAL_TABLET | ORAL | 0 refills | Status: AC
  Filled 2024-12-25: qty 21, 6d supply, fill #0

## 2024-12-25 MED ORDER — LIDOCAINE 5 % EX PTCH
1.0000 | MEDICATED_PATCH | CUTANEOUS | Status: DC
  Administered 2024-12-25: 1 via TRANSDERMAL
  Filled 2024-12-25: qty 1

## 2024-12-25 MED ORDER — ONDANSETRON HCL 4 MG PO TABS
4.0000 mg | ORAL_TABLET | Freq: Four times a day (QID) | ORAL | 0 refills | Status: AC
  Filled 2024-12-25: qty 9, 3d supply, fill #0

## 2024-12-25 MED ORDER — ONDANSETRON HCL 4 MG PO TABS
4.0000 mg | ORAL_TABLET | Freq: Once | ORAL | Status: AC
  Administered 2024-12-25: 4 mg via ORAL
  Filled 2024-12-25: qty 1

## 2024-12-25 NOTE — Discharge Instructions (Signed)
 I have sent your prescription for steroids to the pharmacy.  I have also sent you a medicine called famotidine  which can help protect your stomach.  Please follow-up with your primary care doctor within 1 week.  Return to the emergency room for worsening pain in your back, trouble walking, trouble using the bathroom.  I also recommend trying physical therapy to help strengthen your back going forward.

## 2024-12-25 NOTE — ED Provider Notes (Signed)
 " [place] EMERGENCY DEPARTMENT AT [facility] Provider Note  CSN: [id number] Arrival date & time: 12/25/24 0745  Chief Complaint(s) Back Pain  HPI Russell Gillespie is a 64 y.o. male who is here today for back pain.  Patient reports that about a week ago he slipped going down some stairs and landed on his middle lower back.  He is on Eliquis , noticed that he started to have a pretty good bruise developing there.  States that following that fall, couple days later he started to have some worsening pain in his back.  He has been ambulating, not been having any bowel or bladder urge, incontinence, or retention.  He has a history of 2 prior back surgeries, denies any fever or chills.  Denies any urinary symptoms.  Past Medical History Past Medical History:  Diagnosis Date   Amblyopia of eye, left    reports it is incorrectable    Arthritis    TKA- & THR- based on OA   Chronic kidney disease    kidney stones   Dysrhythmia    occasional irregular HR   Family history of adverse reaction to anesthesia    Mother gets PONV   Headache    pseudomeningocele    Hemorrhoids    Internal thrombosed hemorrhoid-left lateral 05/23/2012   Irregular heart beat    reports  they told me i had a heart murmur since i was a kid in school reports as nonsymptomatic    Pre-diabetes    told that he is borderline    Radiculopathy of lumbar region    Tinnitus    bilateral    Tuberculosis    tests positively on TB skin tests, followed up with chest xray   Patient Active Problem List   Diagnosis Date Noted   Dysphagia 09/21/2020   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 Authorizing  Provider  famotidine  (PEPCID ) 20 MG tablet Take 1 tablet (20 mg total) by mouth 2 (two) times daily. 12/25/24  Yes Mannie Pac T, DO  methylPREDNISolone  (MEDROL  DOSEPAK) 4 MG TBPK tablet Take as instructed by your dose packaging 12/25/24  Yes Mannie Pac T, DO  apixaban  (ELIQUIS ) 5 MG TABS tablet Take 1 tablet (5 mg total) by mouth 2 (two) times daily. 08/15/24   Terra Pac PARAS, PA-C  cyclobenzaprine  (FLEXERIL ) 10 MG tablet Take 10 mg by mouth 3 (three) times daily as needed for muscle spasms.    [provider]  docusate sodium  (COLACE) 100 MG capsule Take 1 capsule (100 mg total) by mouth 2 (two) times daily. Patient taking differently: Take 100 mg by mouth at bedtime.  09/26/19   Mavis Purchase, MD  HYDROcodone -acetaminophen  (NORCO/VICODIN) 5-325 MG tablet Take 1 tablet by mouth 2 (two) times daily as needed for moderate pain.  09/15/20   [provider]  hydrocortisone (ANUSOL-HC) 25 MG suppository Place 25 mg rectally 2 (two) times daily. 07/31/24   [provider]  meloxicam (MOBIC) 15 MG tablet Take 15 mg by mouth daily. 07/28/24   [provider]  metFORMIN  (GLUCOPHAGE ) 500 MG tablet Take 500 mg by mouth daily.    [provider]  methocarbamol  (ROBAXIN ) 500 MG tablet 500 mg every 6 (six) hours as needed for muscle  spasms. 01/15/24   [provider]  penicillin v potassium (VEETID) 500 MG tablet Take 500 mg by mouth 4 (four) times daily. 08/12/24   [provider]  polyethylene glycol (MIRALAX  / GLYCOLAX ) 17 g packet Take 17 g by mouth daily.    [provider]  rosuvastatin  (CRESTOR ) 5 MG tablet Take 5 mg by mouth daily.    [provider]  telmisartan (MICARDIS) 20 MG tablet Take 20 mg by mouth daily.    [provider]  VYVANSE  70 MG capsule Take 70 mg by mouth daily as needed (before work).  09/04/20   [provider]                                                                                                                                     Past Surgical History Past Surgical History:  Procedure Laterality Date   BACK SURGERY  2006   2017- back surg. for bone spurs, 07/2019- L5-S1 rupture   COLONOSCOPY     LUMBAR LAMINECTOMY/DECOMPRESSION MICRODISCECTOMY Bilateral 09/15/2016   Procedure: Bilateral Lumbar four - Lumber Five, Lumbar Five - Sacral One Laminectomy/Foraminotomy;  Surgeon: Catalina Stains, MD;  Location: [facility] OR;  Service: Neurosurgery;  Laterality: Bilateral;  Bilateral Lumbar four - Lumber Five, Lumbar Five - Sacral One Laminectomy/Foraminotomy   LUMBAR WOUND DEBRIDEMENT N/A 11/13/2016   Procedure: EXPLORATION OF LUMBAR WOUND AND REPAIR OF CSF LEAK;  Surgeon: Catalina Stains, MD;  Location: [facility] OR;  Service: Neurosurgery;  Laterality: N/A;  EXPLORATION OF LUMBAR WOUND   LUMBAR WOUND DEBRIDEMENT N/A 09/25/2019   Procedure: WOUND EXPLORATION AND REPAIR OF LUMBAR PSEUDOMENINGOCELE;  Surgeon: Mavis Purchase, MD;  Location: [facility] OR;  Service: Neurosurgery;  Laterality: N/A;   ROTATOR CUFF REPAIR Left 2013   and bone spurs    TONSILLECTOMY     TOTAL HIP ARTHROPLASTY Right 05/03/2016   Procedure: RIGHT TOTAL HIP ARTHROPLASTY ANTERIOR APPROACH;  Surgeon: Dempsey Moan, MD;  Location: [facility] ORS;  Service: Orthopedics;  Laterality: Right;   TOTAL KNEE ARTHROPLASTY  01/2009   left   TOTAL KNEE REVISION Left 10/23/2018   Procedure: left knee polyethylene exchange with patella resurfacing;  Surgeon: Moan Dempsey, MD;  Location: [facility] ORS;  Service: Orthopedics;  Laterality: Left;    Family History Family History  Problem Relation Age of Onset   Diabetes Mother    Hypertension Mother    Dementia Father     Social History Social History[1] Allergies Short ragweed pollen ext, Tramadol , and Tree extract  Review of Systems Review of Systems  Physical Exam Vital Signs  I have reviewed the triage vital signs BP (!) 144/77 (BP Location: Right Arm)   Pulse (!) 46    Temp 97.8 F (36.6 C) (Oral)   Resp 14   SpO2 100%   Physical Exam Vitals and nursing note reviewed.  Constitutional:      Appearance: He is not toxic-appearing.  HENT:     Head: Normocephalic.  Pulmonary:     Effort: Pulmonary effort is normal.  Abdominal:     General: Abdomen is flat.  Musculoskeletal:     Cervical back: Normal range of motion.  Skin:    Comments: Lidocaine  patch present over the patient's lumbosacral junction.  Bruising noted without a large hematoma.  Neurological:     Mental Status: He is alert.     Comments: Normal gait.  5 out of 5 strength with straight leg raise, plantar and dorsi flexion.  No numbness or tingling.  No saddle anesthesia.     ED Results and Treatments Labs (all labs ordered are listed, but only abnormal results are displayed) Labs Reviewed - No data to display                                                                                                                        Radiology CT Lumbar Spine Wo Contrast Result Date: 12/25/2024 CLINICAL DATA:  Back pain for a couple days. Patient reports falling down stairs approximately 1 week ago. History of back surgery. EXAM: CT LUMBAR SPINE WITHOUT CONTRAST TECHNIQUE: Multidetector CT imaging of the lumbar spine was performed without intravenous contrast administration. Multiplanar CT image reconstructions were also generated. RADIATION DOSE REDUCTION: This exam was performed according to the departmental dose-optimization program which includes automated exposure control, adjustment of the mA and/or kV according to patient size and/or use of iterative reconstruction technique. COMPARISON:  Lumbar spine radiographs 01/08/2024. CT of the lumbar spine 07/22/2020. FINDINGS: Segmentation: There are 5 lumbar type vertebral bodies. Alignment: Stable straightening and minimal convex left scoliosis. Vertebrae: No evidence of acute fracture, pars defect or aggressive osseous lesion. Stable postsurgical  changes from posterior decompression at L4 and L5. Multilevel spondylosis with interbody and probable interfacetal ankylosis at L2-3. Paraspinal and other soft tissues: No significant paraspinal findings. Mild aortoiliac atherosclerosis. Punctate nonobstructing calculus in the upper pole of the left kidney. Disc levels: L1-2: Stable disc bulging and endplate osteophytes. No evidence of spinal stenosis or significant foraminal narrowing. L2-3: Chronic loss of disc height with endplate osteophytes, interbody and probable interfacetal ankylosis. Mild facet hypertrophy. No significant spinal stenosis or foraminal narrowing. L3-4: Possible remote postsurgical changes on the right, stable. There is asymmetric right-sided facet hypertrophy with mild disc bulging and endplate osteophytes asymmetric to the right. Similar mild multifactorial spinal stenosis with mild to moderate foraminal narrowing, greater on the right. L4-5: Previous posterior decompression. Loss of disc height with annular disc bulging and endplate osteophytes asymmetric to the left. Moderate bilateral facet hypertrophy. The spinal canal remains decompressed. Stable mild right and moderate left foraminal narrowing. L5-S1: Previous posterior decompression with chronic loss of disc height, annular disc bulging and endplate osteophytes. The spinal canal remains decompressed. Stable chronic lateral recess and foraminal narrowing bilaterally due to spur. Bilateral lumbosacral assimilation joints. IMPRESSION: 1. No acute osseous findings or significant change from previous CT of 07/22/2020. 2. Stable postsurgical changes from posterior  decompression at L4 and L5. 3. Stable multilevel spondylosis with mild multifactorial spinal stenosis at L3-4. 4. Stable chronic lateral recess and foraminal narrowing bilaterally at L5-S1 due to spur. 5.  Aortic Atherosclerosis (ICD10-I70.0). Electronically Signed   By: Elsie Perone M.D.   On: 12/25/2024 08:43    Pertinent  labs & imaging results that were available during my care of the patient were reviewed by me and considered in my medical decision making (see MDM for details).  Medications Ordered in ED Medications  acetaminophen  (TYLENOL ) tablet 1,000 mg (1,000 mg Oral Given 12/25/24 0818)                                                                                                                                     Procedures Procedures  (including critical care time)  Medical Decision Making / ED Course   This patient presents to the ED for concern of back pain, this involves an extensive number of treatment options, and is a complaint that carries with it a high risk of complications and morbidity.  The differential diagnosis includes lumbar fracture, transverse process fracture, back contusion, musculoskeletal injury, less likely infection, less likely cauda equina, less likely spinal epidural abscess.  MDM: Patient no neurological deficits, was able to stand and ambulate without difficulty.  Given the traumatic injury, will obtain imaging of the patient's lumbar spine.  Will provide some analgesia here in the emergency room.  Patient follows with a pain clinic for chronic rib pain secondary to an injury he sustained playing football while he was younger.  He has narcotics at home, has only had to use these sparingly.  Reassessment 9 AM-CT imaging negative for acute process.  Feels a bit better.  Will discharge with prescription for steroids, famotidine .  He will follow-up with his PCP.  Recommended physical therapy for the patient.  Additional history obtained:  -External records from outside source obtained and reviewed including: Chart review including previous notes, labs, imaging, consultation notes   Lab Tests: -I ordered, reviewed, and interpreted labs.   The pertinent results include:   Labs Reviewed - No data to display    Imaging Studies ordered: I ordered imaging studies  including CT lumbar spine I independently visualized and interpreted imaging. I agree with the radiologist interpretation   Medicines ordered and prescription drug management: Meds ordered this encounter  Medications   acetaminophen  (TYLENOL ) tablet 1,000 mg   methylPREDNISolone  (MEDROL  DOSEPAK) 4 MG TBPK tablet    Sig: Take as instructed by your dose packaging    Dispense:  1 each    Refill:  0   famotidine  (PEPCID ) 20 MG tablet    Sig: Take 1 tablet (20 mg total) by mouth 2 (two) times daily.    Dispense:  30 tablet    Refill:  0    -I have reviewed the patients home medicines and have made adjustments as needed  Cardiac Monitoring: The patient was maintained on a cardiac monitor.  I personally viewed and interpreted the cardiac monitored which showed an underlying rhythm of: Normal sinus rhythm  Social Determinants of Health:  Factors impacting patients care include: Lack of access to primary care   Reevaluation: After the interventions noted above, I reevaluated the patient and found that they have :improved  Co morbidities that complicate the patient evaluation  Past Medical History:  Diagnosis Date   Amblyopia of eye, left    reports it is incorrectable    Arthritis    TKA- & THR- based on OA   Chronic kidney disease    kidney stones   Dysrhythmia    occasional irregular HR   Family history of adverse reaction to anesthesia    Mother gets PONV   Headache    pseudomeningocele    Hemorrhoids    Internal thrombosed hemorrhoid-left lateral 05/23/2012   Irregular heart beat    reports  they told me i had a heart murmur since i was a kid in school reports as nonsymptomatic    Pre-diabetes    told that he is borderline    Radiculopathy of lumbar region    Tinnitus    bilateral    Tuberculosis    tests positively on TB skin tests, followed up with chest xray      Dispostion: I considered admission for this patient, however given his reassuring workup  and exam he is appropriate for discharge.     Final Clinical Impression(s) / ED Diagnoses Final diagnoses:  Chronic midline low back pain without sciatica     @PCDICTATION @     [1]  Social History Tobacco Use   Smoking status: Former    Types: Cigarettes   Smokeless tobacco: Former    Quit date: 04/25/1997   Tobacco comments:    Former smoker 08/15/24  Substance Use Topics   Alcohol use: Yes    Comment: occasionally   Drug use: No     Mannie Pac T, DO 12/25/24 0902  "

## 2024-12-25 NOTE — ED Triage Notes (Signed)
 Patient reports back pain starting a couple days ago. He slipped down the stairs a week ago but is unaware if this caused it. He also states he has hx of surgery for L4-5 ruptured disk. Denies trouble with urination or bowel movements. Reports he is on eliquis .

## 2025-01-07 ENCOUNTER — Other Ambulatory Visit (HOSPITAL_COMMUNITY): Payer: Self-pay | Admitting: Internal Medicine

## 2025-01-07 ENCOUNTER — Other Ambulatory Visit (HOSPITAL_BASED_OUTPATIENT_CLINIC_OR_DEPARTMENT_OTHER): Payer: Self-pay

## 2025-01-07 MED ORDER — APIXABAN 5 MG PO TABS
5.0000 mg | ORAL_TABLET | Freq: Two times a day (BID) | ORAL | 3 refills | Status: AC
  Filled 2025-01-07: qty 60, 30d supply, fill #0

## 2025-01-09 ENCOUNTER — Other Ambulatory Visit: Payer: Self-pay

## 2025-01-09 ENCOUNTER — Emergency Department (HOSPITAL_BASED_OUTPATIENT_CLINIC_OR_DEPARTMENT_OTHER)
Admission: EM | Admit: 2025-01-09 | Discharge: 2025-01-09 | Disposition: A | Discharge status: Home / Self Care | Payer: Worker's Compensation | Attending: Emergency Medicine | Admitting: Emergency Medicine

## 2025-01-09 ENCOUNTER — Emergency Department (HOSPITAL_BASED_OUTPATIENT_CLINIC_OR_DEPARTMENT_OTHER): Admitting: Radiology

## 2025-01-09 ENCOUNTER — Encounter (HOSPITAL_BASED_OUTPATIENT_CLINIC_OR_DEPARTMENT_OTHER): Payer: Self-pay | Admitting: Emergency Medicine

## 2025-01-09 DIAGNOSIS — W000XXA Fall on same level due to ice and snow, initial encounter: Secondary | ICD-10-CM | POA: Insufficient documentation

## 2025-01-09 DIAGNOSIS — M25512 Pain in left shoulder: Secondary | ICD-10-CM | POA: Insufficient documentation

## 2025-01-09 DIAGNOSIS — M546 Pain in thoracic spine: Secondary | ICD-10-CM | POA: Insufficient documentation

## 2025-01-09 DIAGNOSIS — Z7901 Long term (current) use of anticoagulants: Secondary | ICD-10-CM | POA: Diagnosis not present

## 2025-01-09 DIAGNOSIS — W19XXXA Unspecified fall, initial encounter: Secondary | ICD-10-CM

## 2025-01-09 MED ORDER — OXYCODONE HCL 5 MG PO CAPS: 5.0000 mg | ORAL_CAPSULE | Freq: Four times a day (QID) | ORAL | 0 refills | Status: AC | PRN

## 2025-01-09 MED ORDER — PREDNISONE 20 MG PO TABS: 40.0000 mg | ORAL_TABLET | Freq: Every day | ORAL | 0 refills | Status: AC

## 2025-01-09 NOTE — Discharge Instructions (Signed)
 Take 1000 mg of Tylenol  every 8 hours. You can take 5 days of steroids.  Take oxycodone  which is a narcotic for breakthrough pain.  With orthopedics and return to the ED with new or worsening symptoms.

## 2025-01-09 NOTE — ED Triage Notes (Signed)
 Fall/ slip on ice happened around 12:30PM Landed on left shoulder and and buttock' Left shoulder, lower and mid back pain

## 2025-02-27 ENCOUNTER — Ambulatory Visit (HOSPITAL_COMMUNITY): Admitting: Internal Medicine
